# Patient Record
Sex: Male | Born: 1945 | Race: White | Hispanic: No | Marital: Married | Smoking: Former smoker
Health system: Southern US, Community
[De-identification: ages and names within clinical notes are randomized; demographics above are authoritative.]

## PROBLEM LIST (undated history)

## (undated) DIAGNOSIS — F419 Anxiety disorder, unspecified: Secondary | ICD-10-CM

## (undated) DIAGNOSIS — K219 Gastro-esophageal reflux disease without esophagitis: Secondary | ICD-10-CM

## (undated) DIAGNOSIS — Z8719 Personal history of other diseases of the digestive system: Secondary | ICD-10-CM

## (undated) DIAGNOSIS — M1990 Unspecified osteoarthritis, unspecified site: Secondary | ICD-10-CM

## (undated) DIAGNOSIS — F329 Major depressive disorder, single episode, unspecified: Secondary | ICD-10-CM

## (undated) DIAGNOSIS — F32A Depression, unspecified: Secondary | ICD-10-CM

## (undated) HISTORY — PX: ROTATOR CUFF REPAIR: SHX139

## (undated) HISTORY — PX: CHOLECYSTECTOMY: SHX55

---

## 2016-05-27 ENCOUNTER — Other Ambulatory Visit: Payer: Self-pay | Admitting: Physician Assistant

## 2016-05-29 ENCOUNTER — Encounter (HOSPITAL_COMMUNITY): Payer: Self-pay

## 2016-05-29 ENCOUNTER — Encounter (HOSPITAL_COMMUNITY)
Admission: RE | Admit: 2016-05-29 | Discharge: 2016-05-29 | Disposition: A | Discharge status: Home / Self Care | Payer: Medicare Other | Source: Ambulatory Visit | Attending: Orthopaedic Surgery | Admitting: Orthopaedic Surgery

## 2016-05-29 DIAGNOSIS — Z882 Allergy status to sulfonamides status: Secondary | ICD-10-CM | POA: Diagnosis not present

## 2016-05-29 DIAGNOSIS — Z0183 Encounter for blood typing: Secondary | ICD-10-CM | POA: Insufficient documentation

## 2016-05-29 DIAGNOSIS — K219 Gastro-esophageal reflux disease without esophagitis: Secondary | ICD-10-CM | POA: Insufficient documentation

## 2016-05-29 DIAGNOSIS — Z79899 Other long term (current) drug therapy: Secondary | ICD-10-CM | POA: Diagnosis not present

## 2016-05-29 DIAGNOSIS — M1611 Unilateral primary osteoarthritis, right hip: Secondary | ICD-10-CM | POA: Diagnosis not present

## 2016-05-29 DIAGNOSIS — Z01812 Encounter for preprocedural laboratory examination: Secondary | ICD-10-CM | POA: Insufficient documentation

## 2016-05-29 DIAGNOSIS — Z87891 Personal history of nicotine dependence: Secondary | ICD-10-CM | POA: Diagnosis not present

## 2016-05-29 HISTORY — DX: Anxiety disorder, unspecified: F41.9

## 2016-05-29 HISTORY — DX: Unspecified osteoarthritis, unspecified site: M19.90

## 2016-05-29 HISTORY — DX: Major depressive disorder, single episode, unspecified: F32.9

## 2016-05-29 HISTORY — DX: Personal history of other diseases of the digestive system: Z87.19

## 2016-05-29 HISTORY — DX: Gastro-esophageal reflux disease without esophagitis: K21.9

## 2016-05-29 HISTORY — DX: Depression, unspecified: F32.A

## 2016-05-29 LAB — CBC
HEMATOCRIT: 51.9 % (ref 39.0–52.0)
HEMOGLOBIN: 18.3 g/dL — AB (ref 13.0–17.0)
MCH: 32.9 pg (ref 26.0–34.0)
MCHC: 35.3 g/dL (ref 30.0–36.0)
MCV: 93.3 fL (ref 78.0–100.0)
Platelets: 246 10*3/uL (ref 150–400)
RBC: 5.56 MIL/uL (ref 4.22–5.81)
RDW: 13.3 % (ref 11.5–15.5)
WBC: 8.7 10*3/uL (ref 4.0–10.5)

## 2016-05-29 LAB — ABO/RH: ABO/RH(D): O POS

## 2016-05-29 NOTE — Patient Instructions (Signed)
Derek Velez  05/29/2016   Your procedure is scheduled on: 06/06/16  Report to [facility] Main  Entrance take [facility]  elevators to 3rd floor to  Short Stay Center at  5:30 AM.  Call this number if you have problems the morning of surgery [phone]   Remember: ONLY 1 PERSON MAY GO WITH YOU TO SHORT STAY TO GET  READY MORNING OF YOUR SURGERY.  Do not eat food or drink liquids :After MidnightThursday                                  You may not have any metal on your body .              Do not wear jewelry, lotions, powders or perfumes, deodorant               Do not bring valuables to the hospital. [place] IS NOT             RESPONSIBLE   FOR VALUABLES.  Contacts, dentures or bridgework may not be worn into surgery.  Leave suitcase in the car. After surgery it may be brought to your room.    _____________________________________________________________________             [facility] - Preparing for Surgery Before surgery, you can play an important role.  Because skin is not sterile, your skin needs to be as free of germs as possible.  You can reduce the number of germs on your skin by washing with CHG (chlorahexidine gluconate) soap before surgery.  CHG is an antiseptic cleaner which kills germs and bonds with the skin to continue killing germs even after washing. Please DO NOT use if you have an allergy to CHG or antibacterial soaps.  If your skin becomes reddened/irritated stop using the CHG and inform your nurse when you arrive at Short Stay. Do not shave (including legs and underarms) for at least 48 hours prior to the first CHG shower.  You may shave your face/neck. Please follow these instructions carefully:  1.  Shower with CHG Soap the night before surgery and the  morning of Surgery.  2.  If you choose to wash your hair, wash your hair first as usual with your  normal  shampoo.  3.  After you shampoo, rinse your hair and body thoroughly to remove  the  shampoo.                           4.  Use CHG as you would any other liquid soap.  You can apply chg directly  to the skin and wash                       Gently with a scrungie or clean washcloth.  5.  Apply the CHG Soap to your body ONLY FROM THE NECK DOWN.   Do not use on face/ open                           Wound or open sores. Avoid contact with eyes, ears mouth and genitals (private parts).                       Wash  face,  Genitals (private parts) with your normal soap.             6.  Wash thoroughly, paying special attention to the area where your surgery  will be performed.  7.  Thoroughly rinse your body with warm water from the neck down.  8.  DO NOT shower/wash with your normal soap after using and rinsing off  the CHG Soap.                9.  Pat yourself dry with a clean towel.            10.  Wear clean pajamas.            11.  Place clean sheets on your bed the night of your first shower and do not  sleep with pets. Day of Surgery : Do not apply any lotions/deodorants the morning of surgery.  Please wear clean clothes to the hospital/surgery center.  FAILURE TO FOLLOW THESE INSTRUCTIONS MAY RESULT IN THE CANCELLATION OF YOUR SURGERY PATIENT SIGNATURE_________________________________  NURSE SIGNATURE__________________________________  ________________________________________________________________________

## 2016-05-30 LAB — SURGICAL PCR SCREEN
MRSA, PCR: POSITIVE — AB
STAPHYLOCOCCUS AUREUS: POSITIVE — AB

## 2016-05-30 NOTE — Progress Notes (Signed)
05-30-16 0815 Pt. 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

## 2016-06-06 ENCOUNTER — Inpatient Hospital Stay (HOSPITAL_COMMUNITY): Payer: Medicare Other

## 2016-06-06 ENCOUNTER — Inpatient Hospital Stay (HOSPITAL_COMMUNITY): Payer: Medicare Other | Admitting: Anesthesiology

## 2016-06-06 ENCOUNTER — Inpatient Hospital Stay (HOSPITAL_COMMUNITY)
Admission: RE | Admit: 2016-06-06 | Discharge: 2016-06-07 | DRG: 470 | Disposition: A | Discharge status: Home Health | Payer: Medicare Other | Source: Ambulatory Visit | Attending: Orthopaedic Surgery | Admitting: Orthopaedic Surgery

## 2016-06-06 ENCOUNTER — Encounter (HOSPITAL_COMMUNITY): Payer: Self-pay

## 2016-06-06 ENCOUNTER — Encounter (HOSPITAL_COMMUNITY)
Admission: RE | Disposition: A | Discharge status: Home Health | Payer: Self-pay | Source: Ambulatory Visit | Attending: Orthopaedic Surgery

## 2016-06-06 DIAGNOSIS — K219 Gastro-esophageal reflux disease without esophagitis: Secondary | ICD-10-CM | POA: Diagnosis present

## 2016-06-06 DIAGNOSIS — Z79899 Other long term (current) drug therapy: Secondary | ICD-10-CM

## 2016-06-06 DIAGNOSIS — Z882 Allergy status to sulfonamides status: Secondary | ICD-10-CM | POA: Diagnosis not present

## 2016-06-06 DIAGNOSIS — M1611 Unilateral primary osteoarthritis, right hip: Secondary | ICD-10-CM | POA: Diagnosis present

## 2016-06-06 DIAGNOSIS — Z91018 Allergy to other foods: Secondary | ICD-10-CM | POA: Diagnosis not present

## 2016-06-06 DIAGNOSIS — Z96641 Presence of right artificial hip joint: Secondary | ICD-10-CM

## 2016-06-06 DIAGNOSIS — F329 Major depressive disorder, single episode, unspecified: Secondary | ICD-10-CM | POA: Diagnosis present

## 2016-06-06 DIAGNOSIS — F419 Anxiety disorder, unspecified: Secondary | ICD-10-CM | POA: Diagnosis present

## 2016-06-06 DIAGNOSIS — Z419 Encounter for procedure for purposes other than remedying health state, unspecified: Secondary | ICD-10-CM

## 2016-06-06 HISTORY — PX: TOTAL HIP ARTHROPLASTY: SHX124

## 2016-06-06 LAB — TYPE AND SCREEN
ABO/RH(D): O POS
ANTIBODY SCREEN: NEGATIVE

## 2016-06-06 SURGERY — ARTHROPLASTY, HIP, TOTAL, ANTERIOR APPROACH
Anesthesia: Spinal | Site: Hip | Laterality: Right

## 2016-06-06 MED ORDER — METOCLOPRAMIDE HCL 5 MG/ML IJ SOLN: 5.0000 mg | Freq: Three times a day (TID) | INTRAMUSCULAR | Status: DC | PRN

## 2016-06-06 MED ORDER — PROPOFOL 10 MG/ML IV BOLUS
INTRAVENOUS | Status: AC
  Filled 2016-06-06: qty 40

## 2016-06-06 MED ORDER — METOCLOPRAMIDE HCL 5 MG PO TABS: 5.0000 mg | ORAL_TABLET | Freq: Three times a day (TID) | ORAL | Status: DC | PRN

## 2016-06-06 MED ORDER — DIPHENHYDRAMINE HCL 12.5 MG/5ML PO ELIX
12.5000 mg | ORAL_SOLUTION | ORAL | Status: DC | PRN
  Administered 2016-06-06: 25 mg via ORAL
  Filled 2016-06-06: qty 10

## 2016-06-06 MED ORDER — FENTANYL CITRATE (PF) 100 MCG/2ML IJ SOLN
INTRAMUSCULAR | Status: DC | PRN
  Administered 2016-06-06 (×2): 50 ug via INTRAVENOUS

## 2016-06-06 MED ORDER — PHENYLEPHRINE HCL 10 MG/ML IJ SOLN
INTRAVENOUS | Status: DC | PRN
  Administered 2016-06-06: 25 ug/min via INTRAVENOUS

## 2016-06-06 MED ORDER — BUPIVACAINE IN DEXTROSE 0.75-8.25 % IT SOLN
INTRATHECAL | Status: DC | PRN
  Administered 2016-06-06: 2 mL via INTRATHECAL

## 2016-06-06 MED ORDER — FENTANYL CITRATE (PF) 100 MCG/2ML IJ SOLN
INTRAMUSCULAR | Status: AC
  Filled 2016-06-06: qty 2

## 2016-06-06 MED ORDER — STERILE WATER FOR IRRIGATION IR SOLN
Status: DC | PRN
  Administered 2016-06-06: 2000 mL

## 2016-06-06 MED ORDER — EPHEDRINE SULFATE 50 MG/ML IJ SOLN
INTRAMUSCULAR | Status: DC | PRN
  Administered 2016-06-06 (×2): 10 mg via INTRAVENOUS

## 2016-06-06 MED ORDER — ONDANSETRON HCL 4 MG/2ML IJ SOLN
INTRAMUSCULAR | Status: DC | PRN
Start: 2016-06-06 — End: 2016-06-06
  Administered 2016-06-06: 4 mg via INTRAVENOUS

## 2016-06-06 MED ORDER — VANCOMYCIN HCL 1000 MG IV SOLR
INTRAVENOUS | Status: DC | PRN
  Administered 2016-06-06: 1000 mg via INTRAVENOUS

## 2016-06-06 MED ORDER — PROMETHAZINE HCL 25 MG/ML IJ SOLN: 6.2500 mg | INTRAMUSCULAR | Status: DC | PRN

## 2016-06-06 MED ORDER — TRANEXAMIC ACID 1000 MG/10ML IV SOLN
1000.0000 mg | INTRAVENOUS | Status: AC
  Administered 2016-06-06: 1000 mg via INTRAVENOUS
  Filled 2016-06-06: qty 1100

## 2016-06-06 MED ORDER — VANCOMYCIN HCL IN DEXTROSE 1-5 GM/200ML-% IV SOLN
1000.0000 mg | Freq: Two times a day (BID) | INTRAVENOUS | Status: AC
Start: 2016-06-06 — End: 2016-06-06
  Administered 2016-06-06: 1000 mg via INTRAVENOUS
  Filled 2016-06-06: qty 200

## 2016-06-06 MED ORDER — METHOCARBAMOL 1000 MG/10ML IJ SOLN
500.0000 mg | Freq: Four times a day (QID) | INTRAMUSCULAR | Status: DC | PRN
  Administered 2016-06-06: 500 mg via INTRAVENOUS
  Filled 2016-06-06: qty 5
  Filled 2016-06-06: qty 550

## 2016-06-06 MED ORDER — PHENOL 1.4 % MT LIQD
1.0000 | OROMUCOSAL | Status: DC | PRN
  Filled 2016-06-06: qty 177

## 2016-06-06 MED ORDER — OXYCODONE HCL 5 MG PO TABS
5.0000 mg | ORAL_TABLET | ORAL | Status: DC | PRN
  Administered 2016-06-06 – 2016-06-07 (×2): 5 mg via ORAL
  Filled 2016-06-06 (×2): qty 1

## 2016-06-06 MED ORDER — FENTANYL CITRATE (PF) 100 MCG/2ML IJ SOLN
25.0000 ug | INTRAMUSCULAR | Status: DC | PRN
  Administered 2016-06-06 (×2): 50 ug via INTRAVENOUS

## 2016-06-06 MED ORDER — SODIUM CHLORIDE 0.9 % IR SOLN
Status: DC | PRN
  Administered 2016-06-06: 1000 mL

## 2016-06-06 MED ORDER — PHENYLEPHRINE HCL 10 MG/ML IJ SOLN
INTRAMUSCULAR | Status: AC
  Filled 2016-06-06: qty 1

## 2016-06-06 MED ORDER — ONDANSETRON HCL 4 MG PO TABS: 4.0000 mg | ORAL_TABLET | Freq: Four times a day (QID) | ORAL | Status: DC | PRN

## 2016-06-06 MED ORDER — MIDAZOLAM HCL 5 MG/5ML IJ SOLN
INTRAMUSCULAR | Status: DC | PRN
  Administered 2016-06-06 (×2): 1 mg via INTRAVENOUS

## 2016-06-06 MED ORDER — POLYETHYLENE GLYCOL 3350 17 G PO PACK: 17.0000 g | PACK | Freq: Every day | ORAL | Status: DC | PRN

## 2016-06-06 MED ORDER — PANTOPRAZOLE SODIUM 40 MG PO TBEC
40.0000 mg | DELAYED_RELEASE_TABLET | ORAL | Status: DC
  Filled 2016-06-06: qty 1

## 2016-06-06 MED ORDER — ACETAMINOPHEN 325 MG PO TABS: 650.0000 mg | ORAL_TABLET | Freq: Four times a day (QID) | ORAL | Status: DC | PRN

## 2016-06-06 MED ORDER — DOCUSATE SODIUM 100 MG PO CAPS
100.0000 mg | ORAL_CAPSULE | Freq: Two times a day (BID) | ORAL | Status: DC
  Administered 2016-06-06 – 2016-06-07 (×2): 100 mg via ORAL
  Filled 2016-06-06 (×2): qty 1

## 2016-06-06 MED ORDER — ASPIRIN EC 325 MG PO TBEC
325.0000 mg | DELAYED_RELEASE_TABLET | Freq: Every day | ORAL | Status: DC
  Administered 2016-06-07: 325 mg via ORAL
  Filled 2016-06-06: qty 1

## 2016-06-06 MED ORDER — CHLORHEXIDINE GLUCONATE 4 % EX LIQD: 60.0000 mL | Freq: Once | CUTANEOUS | Status: DC

## 2016-06-06 MED ORDER — EPHEDRINE SULFATE 50 MG/ML IJ SOLN
INTRAMUSCULAR | Status: AC
  Filled 2016-06-06: qty 1

## 2016-06-06 MED ORDER — ONDANSETRON HCL 4 MG/2ML IJ SOLN: 4.0000 mg | Freq: Four times a day (QID) | INTRAMUSCULAR | Status: DC | PRN

## 2016-06-06 MED ORDER — CEFAZOLIN SODIUM-DEXTROSE 2-4 GM/100ML-% IV SOLN: 2.0000 g | INTRAVENOUS | Status: DC

## 2016-06-06 MED ORDER — PANTOPRAZOLE SODIUM 40 MG PO TBEC: 40.0000 mg | DELAYED_RELEASE_TABLET | Freq: Every day | ORAL | Status: DC

## 2016-06-06 MED ORDER — ACETAMINOPHEN 650 MG RE SUPP: 650.0000 mg | Freq: Four times a day (QID) | RECTAL | Status: DC | PRN

## 2016-06-06 MED ORDER — ALUM & MAG HYDROXIDE-SIMETH 200-200-20 MG/5ML PO SUSP: 30.0000 mL | ORAL | Status: DC | PRN

## 2016-06-06 MED ORDER — LACTATED RINGERS IV SOLN: INTRAVENOUS | Status: DC

## 2016-06-06 MED ORDER — SODIUM CHLORIDE 0.9 % IJ SOLN
INTRAMUSCULAR | Status: AC
  Filled 2016-06-06: qty 10

## 2016-06-06 MED ORDER — HYDROMORPHONE HCL 1 MG/ML IJ SOLN: 1.0000 mg | INTRAMUSCULAR | Status: DC | PRN

## 2016-06-06 MED ORDER — MIDAZOLAM HCL 2 MG/2ML IJ SOLN
INTRAMUSCULAR | Status: AC
  Filled 2016-06-06: qty 2

## 2016-06-06 MED ORDER — LORAZEPAM 1 MG PO TABS
1.0000 mg | ORAL_TABLET | Freq: Every day | ORAL | Status: DC
  Administered 2016-06-06: 1 mg via ORAL
  Filled 2016-06-06: qty 1

## 2016-06-06 MED ORDER — ONDANSETRON HCL 4 MG/2ML IJ SOLN
INTRAMUSCULAR | Status: AC
  Filled 2016-06-06: qty 2

## 2016-06-06 MED ORDER — VANCOMYCIN HCL IN DEXTROSE 1-5 GM/200ML-% IV SOLN
INTRAVENOUS | Status: AC
  Filled 2016-06-06: qty 200

## 2016-06-06 MED ORDER — SODIUM CHLORIDE 0.9 % IV SOLN
INTRAVENOUS | Status: DC
  Administered 2016-06-06: 13:00:00 via INTRAVENOUS

## 2016-06-06 MED ORDER — LACTATED RINGERS IV SOLN
INTRAVENOUS | Status: DC | PRN
  Administered 2016-06-06 (×2): via INTRAVENOUS

## 2016-06-06 MED ORDER — PROPOFOL 500 MG/50ML IV EMUL
INTRAVENOUS | Status: DC | PRN
  Administered 2016-06-06: 100 ug/kg/min via INTRAVENOUS

## 2016-06-06 MED ORDER — MENTHOL 3 MG MT LOZG: 1.0000 | LOZENGE | OROMUCOSAL | Status: DC | PRN

## 2016-06-06 MED ORDER — KETOROLAC TROMETHAMINE 15 MG/ML IJ SOLN
7.5000 mg | Freq: Four times a day (QID) | INTRAMUSCULAR | Status: AC
  Administered 2016-06-06 – 2016-06-07 (×4): 7.5 mg via INTRAVENOUS
  Filled 2016-06-06 (×4): qty 1

## 2016-06-06 MED ORDER — METHOCARBAMOL 500 MG PO TABS: 500.0000 mg | ORAL_TABLET | Freq: Four times a day (QID) | ORAL | Status: DC | PRN

## 2016-06-06 SURGICAL SUPPLY — 45 items
BAG ZIPLOCK 12X15 (MISCELLANEOUS) ×3 IMPLANT
BENZOIN TINCTURE PRP APPL 2/3 (GAUZE/BANDAGES/DRESSINGS) ×3 IMPLANT
BLADE SAW SGTL 18X1.27X75 (BLADE) ×2 IMPLANT
BLADE SAW SGTL 18X1.27X75MM (BLADE) ×1
CAPT HIP TOTAL 2 ×3 IMPLANT
CELLS DAT CNTRL 66122 CELL SVR (MISCELLANEOUS) ×1 IMPLANT
CLOSURE WOUND 1/2 X4 (GAUZE/BANDAGES/DRESSINGS) ×1
CLOTH BEACON ORANGE TIMEOUT ST (SAFETY) ×3 IMPLANT
DRAPE STERI IOBAN 125X83 (DRAPES) ×3 IMPLANT
DRAPE U-SHAPE 47X51 STRL (DRAPES) ×6 IMPLANT
DRSG AQUACEL AG ADV 3.5X10 (GAUZE/BANDAGES/DRESSINGS) ×3 IMPLANT
DURAPREP 26ML APPLICATOR (WOUND CARE) ×3 IMPLANT
ELECT REM PT RETURN 9FT ADLT (ELECTROSURGICAL) ×3
ELECTRODE REM PT RTRN 9FT ADLT (ELECTROSURGICAL) ×1 IMPLANT
GAUZE XEROFORM 1X8 LF (GAUZE/BANDAGES/DRESSINGS) IMPLANT
GLOVE BIO SURGEON STRL SZ 6.5 (GLOVE) ×2 IMPLANT
GLOVE BIO SURGEON STRL SZ7.5 (GLOVE) ×3 IMPLANT
GLOVE BIO SURGEONS STRL SZ 6.5 (GLOVE) ×1
GLOVE BIOGEL PI IND STRL 6.5 (GLOVE) ×1 IMPLANT
GLOVE BIOGEL PI IND STRL 7.0 (GLOVE) ×1 IMPLANT
GLOVE BIOGEL PI IND STRL 7.5 (GLOVE) ×2 IMPLANT
GLOVE BIOGEL PI IND STRL 8 (GLOVE) ×2 IMPLANT
GLOVE BIOGEL PI INDICATOR 6.5 (GLOVE) ×2
GLOVE BIOGEL PI INDICATOR 7.0 (GLOVE) ×2
GLOVE BIOGEL PI INDICATOR 7.5 (GLOVE) ×4
GLOVE BIOGEL PI INDICATOR 8 (GLOVE) ×4
GLOVE ECLIPSE 8.0 STRL XLNG CF (GLOVE) ×6 IMPLANT
GLOVE SURG SS PI 7.0 STRL IVOR (GLOVE) ×3 IMPLANT
GOWN STRL REUS W/TWL XL LVL3 (GOWN DISPOSABLE) ×12 IMPLANT
HANDPIECE INTERPULSE COAX TIP (DISPOSABLE) ×2
HOLDER FOLEY CATH W/STRAP (MISCELLANEOUS) ×3 IMPLANT
PACK ANTERIOR HIP CUSTOM (KITS) ×3 IMPLANT
RTRCTR WOUND ALEXIS 18CM MED (MISCELLANEOUS) ×3
SET HNDPC FAN SPRY TIP SCT (DISPOSABLE) ×1 IMPLANT
STAPLER VISISTAT 35W (STAPLE) IMPLANT
STRIP CLOSURE SKIN 1/2X4 (GAUZE/BANDAGES/DRESSINGS) ×2 IMPLANT
SUT ETHIBOND NAB CT1 #1 30IN (SUTURE) ×3 IMPLANT
SUT MNCRL AB 4-0 PS2 18 (SUTURE) IMPLANT
SUT VIC AB 0 CT1 36 (SUTURE) ×3 IMPLANT
SUT VIC AB 1 CT1 36 (SUTURE) ×3 IMPLANT
SUT VIC AB 2-0 CT1 27 (SUTURE) ×4
SUT VIC AB 2-0 CT1 TAPERPNT 27 (SUTURE) ×2 IMPLANT
TRAY CATH 16FR W/PLASTIC CATH (SET/KITS/TRAYS/PACK) ×3 IMPLANT
TRAY FOLEY W/METER SILVER 16FR (SET/KITS/TRAYS/PACK) ×3 IMPLANT
YANKAUER SUCT BULB TIP NO VENT (SUCTIONS) ×3 IMPLANT

## 2016-06-06 NOTE — Anesthesia Preprocedure Evaluation (Addendum)
Anesthesia Evaluation  Patient identified by MRN, date of birth, ID band Patient awake    Reviewed: Allergy & Precautions, NPO status , Patient's Chart, lab work & pertinent test results  Airway Mallampati: I  TM Distance: >3 FB Neck ROM: Full    Dental  (+) Edentulous Upper, Edentulous Lower   Pulmonary neg pulmonary ROS, former smoker,    Pulmonary exam normal        Cardiovascular negative cardio ROS Normal cardiovascular exam     Neuro/Psych PSYCHIATRIC DISORDERS Depression negative neurological ROS     GI/Hepatic Neg liver ROS, hiatal hernia, GERD  ,  Endo/Other  negative endocrine ROS  Renal/GU negative Renal ROS  negative genitourinary   Musculoskeletal negative musculoskeletal ROS (+) Arthritis ,   Abdominal Normal abdominal exam  (+)   Peds negative pediatric ROS (+)  Hematology negative hematology ROS (+)   Anesthesia Other Findings   Reproductive/Obstetrics negative OB ROS                            Anesthesia Physical Anesthesia Plan  ASA: II  Anesthesia Plan: Spinal   Post-op Pain Management:    Induction:   Airway Management Planned: Nasal Cannula  Additional Equipment: None  Intra-op Plan:   Post-operative Plan:   Informed Consent:   Plan Discussed with: CRNA and Surgeon  Anesthesia Plan Comments:        Anesthesia Quick Evaluation

## 2016-06-06 NOTE — Op Note (Signed)
NAMManus Rudd:  Goody, Eliasar                 ACCOUNT NO.:  [id number]  MEDICAL RECORD NO.:  [id number]  LOCATION:  [facility]                         [facility]  PHYSICIAN:  Vanita PandaChristopher Y. 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  PROCEDURE:  Right total hip arthroplasty through direct anterior approach.  IMPLANTS:  DePuy sector cup/liner size 52 with a single screw, size 36+ 0 neutral polyethylene liner, size 10 Corail femoral component with standard offset, size 36+ 1.5 ceramic hip ball.  SURGEON:  Park Literhristopher Y Butt, MD  ASSISTANT:  Richardean CanalGilbert Clark, PA-C.  ANESTHESIA:  Spinal.  ANTIBIOTICS:  1 g IV vancomycin.  BLOOD LOSS:  200 mL.  COMPLICATIONS:  None.  INDICATIONS:  Mr. Melvyn NethLewis is a 70 year old gentleman with severe debilitating arthritis of his right hip.  This has been getting rapidly worse after about 3 years.  He says it has been hurting for many years now.  His right leg is now significantly shorter than the left side. His x-ray show flattening of the femoral head and complete loss of the joint space with sclerotic and cystic changes and almost appears its necrosis on top of osteoarthritis.  With the failure of conservative treatment, he wished to proceed with a total hip arthroplasty through direct anterior approach.  He understands the risk of acute blood loss anemia, nerve and vessel injury, fracture, infection, dislocation, DVT. He understands our goals are decreased pain, improved mobility, and overall improved quality of life.  DESCRIPTION OF PROCEDURE:  After informed consent was obtained, appropriate right hip was marked.  He was brought to the operating room. Spinal  anesthesia was obtained on a stretcher.  He was then laid in a supine position.  Traction boots were placed on both his feet.  Next, he was placed supine on the Hana fracture table with the perineal post in place and both legs in inline skeletal traction.  Time-out was called. We then prepped the right hip with DuraPrep and sterile drapes.  Time- out was called to identify correct patient, correct right hip.  I then made an incision inferior and posterior to the anterior superior iliac spine and carried this obliquely down the leg.  We dissected down the tensor fascia lata muscle and tensor fascia was then divided longitudinally, so we could proceed with a direct anterior approach to the hip.  We identified and cauterized the circumflex vessels and then identified the hip capsule.  I opened up the hip capsule in L-type format finding a very large joint effusion.  We placed Cobra retractors within the hip capsule and then made our femoral neck cut with an oscillating saw proximal to the lesser trochanter and completed this on osteotome.  We placed a corkscrew guide in the femoral head and removed the femoral head its entirety and found to be completely devoid of cartilage.  I then placed a bent Hohmann over the medial acetabular rim and cleaned the remnants  of the acetabular labrum.  I then began reaming under direct visualization from a size 43 reamer and stepwise increments up to a size 52 with all reamers under direct visualization.  The last reamer under direct fluoroscopy, so I could obtain my depth of reaming, our inclination, and anteversion.  Once I was pleased with this, I placed the real DePuy Sector Gription acetabular component size 52, I did place a single screw.  I then put in a 36+ 0 neutral polyethylene liner for that size acetabular component.  Attention was then turned to the femur.  With the leg externally rotated to 120 degrees, extended and adducted, I was able to  release the lateral joint capsule.  I used a Mueller retractor medially and a Hohmann retractor behind the greater trochanter.  I then used a rongeur to lateralize and a box cutting osteotome to enter the femoral canal.  We then began broaching from a size 8 broach only up to a size 10 broach because he has a narrow canal with a size 10 broach in place, we trialed a standard offset femoral neck and a 36+ 1.5 hip ball.  We brought the leg back over and up with traction and rotation reducing the pelvis.  We were pleased with leg length, offset, and stability on range of motion.  We then dislocated the hip and removed the trial components.  We then were able to place the real Corail femoral component with standard offset, size 10 and the real size 36+ 1.5 ceramic hip ball.  We then reduced this in the pelvis again we were into the acetabulum.  We were pleased with stability.  We then irrigated the soft tissue with normal saline solution using pulsatile lavage.  We were able to close the joint capsule with interrupted #1 Ethibond suture followed by running #1 Vicryl the tensor fascia, 0 Vicryl deep tissue, 2-0 Vicryl in the subcutaneous tissue, 4-0 Monocryl subcuticular stitch, and Steri-Strips on the skin.  An Aquacel dressing was applied.  In and out Foley catheter was then performed.  He was then taken recovery room in stable condition.  All final counts were correct.  There were no complications noted.  Of note, Richardean Canal, PA- C assisted in the entire case.  His assistance was crucial for facilitating all aspects of this case.     Vanita Panda. Magnus Ivan, M.D.     CYB/MEDQ  D:  06/06/2016  T:  06/06/2016  Job:  [id number]

## 2016-06-06 NOTE — Evaluation (Signed)
Physical Therapy Evaluation Patient Details Name: Derek Velez MRN: [id number] DOB: 09/21/1946 Today's Date: 06/06/2016   History of Present Illness  Pt s/p R THR  Clinical Impression  Pt s/p R THR presents with decreased R LE strength/ROM and post op pain limiting functional mobility.  Pt should progress well to dc home with family assist and HHPT follow up.    Follow Up Recommendations Home health PT    Equipment Recommendations  None recommended by PT    Recommendations for Other Services OT consult     Precautions / Restrictions Precautions Precautions: Fall Restrictions Weight Bearing Restrictions: No Other Position/Activity Restrictions: WBAT      Mobility  Bed Mobility Overal bed mobility: Needs Assistance Bed Mobility: Supine to Sit     Supine to sit: Min assist     General bed mobility comments: cues for sequence and use of L LE to self assist  Transfers Overall transfer level: Needs assistance Equipment used: Rolling walker (2 wheeled) Transfers: Sit to/from Stand Sit to Stand: Min assist         General transfer comment: cues for LE management and use of UEs to self assist  Ambulation/Gait Ambulation/Gait assistance: Min assist Ambulation Distance (Feet): 123 Feet Assistive device: Rolling walker (2 wheeled) Gait Pattern/deviations: Step-to pattern;Step-through pattern;Decreased step length - right;Decreased step length - left;Shuffle;Trunk flexed     General Gait Details: cues for posture, position from RW and initial sequence  Stairs            Wheelchair Mobility    Modified Rankin (Stroke Patients Only)       Balance                                             Pertinent Vitals/Pain Pain Assessment: 0-10 Pain Score: 3  Pain Location: R hip Pain Descriptors / Indicators: Aching;Sore Pain Intervention(s): Limited activity within patient's tolerance;Monitored during session;Premedicated before session;Ice  applied    Home Living Family/patient expects to be discharged to:: Private residence Living Arrangements: Spouse/significant other Available Help at Discharge: Family Type of Home: House Home Access: Stairs to enter   Secretary/administrator of Steps: 1 Home Layout: One level Home Equipment: Environmental consultant - 2 wheels      Prior Function Level of Independence: Independent               Hand Dominance        Extremity/Trunk Assessment   Upper Extremity Assessment: Overall WFL for tasks assessed           Lower Extremity Assessment: RLE deficits/detail      Cervical / Trunk Assessment: Normal  Communication   Communication: No difficulties  Cognition Arousal/Alertness: Awake/alert Behavior During Therapy: WFL for tasks assessed/performed Overall Cognitive Status: Within Functional Limits for tasks assessed                      General Comments      Exercises Total Joint Exercises Ankle Circles/Pumps: AROM;Both;15 reps;Supine      Assessment/Plan    PT Assessment Patient needs continued PT services  PT Diagnosis Difficulty walking   PT Problem List Decreased strength;Decreased range of motion;Decreased activity tolerance;Decreased mobility;Decreased knowledge of use of DME;Pain;Decreased knowledge of precautions  PT Treatment Interventions DME instruction;Gait training;Stair training;Functional mobility training;Therapeutic activities;Therapeutic exercise;Patient/family education   PT Goals (Current goals can be found in the  Care Plan section) Acute Rehab PT Goals Patient Stated Goal: Regain IND and walk without a limp PT Goal Formulation: With patient Time For Goal Achievement: 06/11/16 Potential to Achieve Goals: Good    Frequency 7X/week   Barriers to discharge        Co-evaluation               End of Session Equipment Utilized During Treatment: Gait belt Activity Tolerance: Patient tolerated treatment well Patient left: in  chair;with call bell/phone within reach;with chair alarm set;with family/visitor present Nurse Communication: Mobility status         Time: [id number] PT Time Calculation (min) (ACUTE ONLY): 23 min   Charges:   PT Evaluation $PT Eval Low Complexity: 1 Procedure PT Treatments $Gait Training: 8-22 mins   PT G Codes:        Neeti Knudtson 06/06/2016, 6:11 PM

## 2016-06-06 NOTE — Anesthesia Postprocedure Evaluation (Signed)
Anesthesia Post Note  Patient: Rockne MenghiniBruce Rushing  Procedure(s) Performed: Procedure(s) (LRB): RIGHT TOTAL HIP ARTHROPLASTY ANTERIOR APPROACH (Right)  Patient location during evaluation: PACU Anesthesia Type: Spinal Level of consciousness: oriented and awake and alert Pain management: pain level controlled Vital Signs Assessment: post-procedure vital signs reviewed and stable Respiratory status: spontaneous breathing, respiratory function stable and patient connected to nasal cannula oxygen Cardiovascular status: blood pressure returned to baseline and stable Postop Assessment: no headache and no backache Anesthetic complications: no    Last Vitals:  Vitals:   06/06/16 0526 06/06/16 0915  BP: (!) 146/94 112/70  Pulse: 80 94  Resp: 18   Temp: 37.1 C 36.6 C    Last Pain:  Vitals:   06/06/16 0915  TempSrc:   PainSc: 0-No pain                 Bonita Quinichard S Caelan Branden

## 2016-06-06 NOTE — Brief Op Note (Signed)
06/06/2016  8:49 AM  PATIENT:  Rockne MenghiniBruce Crisostomo  70 y.o. male  PRE-OPERATIVE DIAGNOSIS:  Severe osteoarthritis right hip  POST-OPERATIVE DIAGNOSIS:  Severe osteoarthritis right hip  PROCEDURE:  Procedure(s): RIGHT TOTAL HIP ARTHROPLASTY ANTERIOR APPROACH (Right)  SURGEON:  Surgeon(s) and Role:    * Kathryne Hitchhristopher Y Yordy Matton, MD - Primary  PHYSICIAN ASSISTANT: Rexene EdisonGil Clark, PA-C  ANESTHESIA:   spinal  EBL:  Total I/O In: -  Out: 200 [Blood:200]  COUNTS:  YES  DICTATION: .Other Dictation: Dictation Number [id number]  PLAN OF CARE: Admit to inpatient   PATIENT DISPOSITION:  PACU - hemodynamically stable.   Delay start of Pharmacological VTE agent (>24hrs) due to surgical blood loss or risk of bleeding: no

## 2016-06-06 NOTE — Transfer of Care (Signed)
Immediate Anesthesia Transfer of Care Note  Patient: Derek Velez  Procedure(s) Performed: Procedure(s): RIGHT TOTAL HIP ARTHROPLASTY ANTERIOR APPROACH (Right)  Patient Location: PACU  Anesthesia Type:Spinal  Level of Consciousness:  sedated, patient cooperative and responds to stimulation  Airway & Oxygen Therapy:Patient Spontanous Breathing and Patient connected to face mask oxgen  Post-op Assessment:  Report given to PACU RN and Post -op Vital signs reviewed and stable  Post vital signs:  Reviewed and stable  Last Vitals:  Vitals:   06/06/16 0526  BP: (!) 146/94  Pulse: 80  Resp: 18  Temp: 37.1 C    Complications: No apparent anesthesia complications

## 2016-06-06 NOTE — Progress Notes (Signed)
Patient and family education completed on contact isolation,  Positive PCR of staph and MRSA

## 2016-06-06 NOTE — H&P (Signed)
TOTAL HIP ADMISSION H&P  Patient is admitted for right total hip arthroplasty.  Subjective:  Chief Complaint: right hip pain  HPI: Derek Velez, 70 y.o. male, has a history of pain and functional disability in the right hip(s) due to arthritis and patient has failed non-surgical conservative treatments for greater than 12 weeks to include NSAID's and/or analgesics, use of assistive devices and activity modification.  Onset of symptoms was gradual starting 3 years ago with rapidlly worsening course since that time.The patient noted no past surgery on the right hip(s).  Patient currently rates pain in the right hip at 10 out of 10 with activity. Patient has night pain, worsening of pain with activity and weight bearing, trendelenberg gait, pain that interfers with activities of daily living and pain with passive range of motion. Patient has evidence of subchondral cysts, subchondral sclerosis, periarticular osteophytes and joint space narrowing by imaging studies. This condition presents safety issues increasing the risk of falls.  There is no current active infection.  Patient Active Problem List   Diagnosis Date Noted  . Osteoarthritis of right hip 06/06/2016   Past Medical History:  Diagnosis Date  . Anxiety    2002  . Arthritis   . Depression    2002  . GERD (gastroesophageal reflux disease)   . History of hiatal hernia     Past Surgical History:  Procedure Laterality Date  . CHOLECYSTECTOMY    . ROTATOR CUFF REPAIR     both shoulders    Prescriptions Prior to Admission  Medication Sig Dispense Refill Last Dose  . diphenhydrAMINE (BENADRYL) 25 MG tablet Take 50 mg by mouth at bedtime.   06/05/2016 at pm  . LORazepam (ATIVAN) 1 MG tablet Take 1 mg by mouth at bedtime.   06/05/2016 at pm  . meloxicam (MOBIC) 7.5 MG tablet Take 7.5 mg by mouth daily as needed for pain.   Past Month at Unknown time  . omeprazole (PRILOSEC) 20 MG capsule Take 20 mg by mouth every evening.   06/05/2016 at  pm   Allergies  Allergen Reactions  . Banana Other (See Comments)    Severe headache  . Sulfa Antibiotics Rash    Social History  Substance Use Topics  . Smoking status: Former Smoker    Quit date: 05/30/2003  . Smokeless tobacco: Current User    Types: Snuff  . Alcohol use No    History reviewed. No pertinent family history.   Review of Systems  Musculoskeletal: Positive for joint pain.  All other systems reviewed and are negative.   Objective:  Physical Exam  Constitutional: He is oriented to person, place, and time. He appears well-developed and well-nourished.  HENT:  Head: Normocephalic and atraumatic.  Eyes: EOM are normal. Pupils are equal, round, and reactive to light.  Neck: Normal range of motion. Neck supple.  Cardiovascular: Normal rate and regular rhythm.   Respiratory: Effort normal and breath sounds normal.  GI: Soft. Bowel sounds are normal.  Musculoskeletal:       Right hip: He exhibits decreased range of motion, decreased strength, tenderness and bony tenderness.  Neurological: He is alert and oriented to person, place, and time.  Skin: Skin is warm and dry.  Psychiatric: He has a normal mood and affect.    Vital signs in last 24 hours: Temp:  [98.8 F (37.1 C)] 98.8 F (37.1 C) (08/11 0526) Pulse Rate:  [80] 80 (08/11 0526) Resp:  [18] 18 (08/11 0526) BP: (146)/(94) 146/94 (08/11 0526) SpO2:  [  92 %] 92 % (08/11 0526) Weight:  [78.7 kg (173 lb 8 oz)] 78.7 kg (173 lb 8 oz) (08/11 0534)  Labs:   Estimated body mass index is 26 kg/m as calculated from the following:   Height as of this encounter: 5' 8.5" (1.74 m).   Weight as of this encounter: 78.7 kg (173 lb 8 oz).   Imaging Review Plain radiographs demonstrate severe degenerative joint disease of the right hip(s). The bone quality appears to be good for age and reported activity level.  Assessment/Plan:  End stage arthritis, right hip(s)  The patient history, physical examination,  clinical judgement of the provider and imaging studies are consistent with end stage degenerative joint disease of the right hip(s) and total hip arthroplasty is deemed medically necessary. The treatment options including medical management, injection therapy, arthroscopy and arthroplasty were discussed at length. The risks and benefits of total hip arthroplasty were presented and reviewed. The risks due to aseptic loosening, infection, stiffness, dislocation/subluxation,  thromboembolic complications and other imponderables were discussed.  The patient acknowledged the explanation, agreed to proceed with the plan and consent was signed. Patient is being admitted for inpatient treatment for surgery, pain control, PT, OT, prophylactic antibiotics, VTE prophylaxis, progressive ambulation and ADL's and discharge planning.The patient is planning to be discharged home with home health services

## 2016-06-06 NOTE — Anesthesia Procedure Notes (Signed)
Spinal  Patient location during procedure: OR Start time: 06/06/2016 7:35 AM End time: 06/06/2016 7:40 AM Staffing Anesthesiologist: Bonita QuinGUIDETTI, Derek Velez Performed: anesthesiologist  Preanesthetic Checklist Completed: patient identified, site marked, surgical consent, pre-op evaluation, timeout performed, IV checked, risks and benefits discussed and monitors and equipment checked Spinal Block Patient position: sitting Prep: ChloraPrep Patient monitoring: heart rate, cardiac monitor, continuous pulse ox and blood pressure Approach: midline Location: L4-5 Injection technique: single-shot Needle Needle type: Pencil-Tip  Needle gauge: 22 G Needle length: 10 cm Assessment Sensory level: L1 Additional Notes 2 ml 0.75% bupivacaine injected

## 2016-06-07 LAB — BASIC METABOLIC PANEL
ANION GAP: 5 (ref 5–15)
BUN: 16 mg/dL (ref 6–20)
CHLORIDE: 104 mmol/L (ref 101–111)
CO2: 26 mmol/L (ref 22–32)
Calcium: 8 mg/dL — ABNORMAL LOW (ref 8.9–10.3)
Creatinine, Ser: 0.88 mg/dL (ref 0.61–1.24)
Glucose, Bld: 108 mg/dL — ABNORMAL HIGH (ref 65–99)
POTASSIUM: 3.3 mmol/L — AB (ref 3.5–5.1)
SODIUM: 135 mmol/L (ref 135–145)

## 2016-06-07 LAB — CBC
HCT: 37.7 % — ABNORMAL LOW (ref 39.0–52.0)
HEMOGLOBIN: 12.7 g/dL — AB (ref 13.0–17.0)
MCH: 31.6 pg (ref 26.0–34.0)
MCHC: 33.7 g/dL (ref 30.0–36.0)
MCV: 93.8 fL (ref 78.0–100.0)
PLATELETS: 194 10*3/uL (ref 150–400)
RBC: 4.02 MIL/uL — AB (ref 4.22–5.81)
RDW: 13.5 % (ref 11.5–15.5)
WBC: 8.7 10*3/uL (ref 4.0–10.5)

## 2016-06-07 MED ORDER — ASPIRIN 325 MG PO TBEC: 325.0000 mg | DELAYED_RELEASE_TABLET | Freq: Every day | ORAL | 0 refills | Status: DC

## 2016-06-07 MED ORDER — OXYCODONE-ACETAMINOPHEN 5-325 MG PO TABS: 1.0000 | ORAL_TABLET | ORAL | 0 refills | Status: DC | PRN

## 2016-06-07 MED ORDER — METHOCARBAMOL 500 MG PO TABS: 500.0000 mg | ORAL_TABLET | Freq: Four times a day (QID) | ORAL | 0 refills | Status: DC | PRN

## 2016-06-07 NOTE — Progress Notes (Signed)
Physical Therapy Treatment Patient Details Name: Derek Velez MRN: [id number] DOB: 1946-07-19 Today's Date: 06/07/2016    History of Present Illness Pt s/p R THR    PT Comments    Pt progressing well and eager for return home.  Reviewed home therex, stairs and car transfers with pt and spouse.  Follow Up Recommendations  No PT follow up (Pt prefers no HHPT follow up.  Home therex program provided)     Equipment Recommendations  None recommended by PT    Recommendations for Other Services       Precautions / Restrictions Precautions Precautions: Fall Restrictions Weight Bearing Restrictions: No Other Position/Activity Restrictions: WBAT    Mobility  Bed Mobility Overal bed mobility: Needs Assistance Bed Mobility: Supine to Sit     Supine to sit: Supervision        Transfers Overall transfer level: Needs assistance Equipment used: Rolling walker (2 wheeled) Transfers: Sit to/from Stand Sit to Stand: Supervision         General transfer comment: cues for LE management and use of UEs to self assist  Ambulation/Gait Ambulation/Gait assistance: Min guard;Supervision Ambulation Distance (Feet): 150 Feet Assistive device: Rolling walker (2 wheeled) Gait Pattern/deviations: Step-to pattern;Step-through pattern;Decreased step length - right;Decreased step length - left;Shuffle;Trunk flexed Gait velocity: decr Gait velocity interpretation: Below normal speed for age/gender General Gait Details: cues for posture, position from RW and initial sequence   Stairs Stairs: Yes Stairs assistance: Min guard Stair Management: No rails;Step to pattern;Forwards;With walker Number of Stairs: 2 General stair comments: single step twice with RW and min cues for sequence and foot placement  Wheelchair Mobility    Modified Rankin (Stroke Patients Only)       Balance                                    Cognition Arousal/Alertness: Awake/alert Behavior  During Therapy: WFL for tasks assessed/performed Overall Cognitive Status: Within Functional Limits for tasks assessed                      Exercises Total Joint Exercises Ankle Circles/Pumps: AROM;Both;15 reps;Supine Quad Sets: AROM;Both;10 reps;Supine Heel Slides: AAROM;Right;20 reps;Supine Hip ABduction/ADduction: AAROM;Right;15 reps;Supine Long Arc Quad: AROM;Right;10 reps;Supine    General Comments        Pertinent Vitals/Pain Pain Assessment: 0-10 Pain Score: 5  Pain Location: R hip Pain Descriptors / Indicators: Aching;Sore Pain Intervention(s): Limited activity within patient's tolerance;Monitored during session;Ice applied;Patient requesting pain meds-RN notified    Home Living                      Prior Function            PT Goals (current goals can now be found in the care plan section) Acute Rehab PT Goals Patient Stated Goal: Regain IND and walk without a limp PT Goal Formulation: With patient Time For Goal Achievement: 06/11/16 Potential to Achieve Goals: Good Progress towards PT goals: Progressing toward goals    Frequency  7X/week    PT Plan Discharge plan needs to be updated    Co-evaluation             End of Session Equipment Utilized During Treatment: Gait belt Activity Tolerance: Patient tolerated treatment well Patient left: in chair;with call bell/phone within reach;with chair alarm set;with family/visitor present     Time: [id number] PT Time Calculation (min) (ACUTE  ONLY): 31 min  Charges:  $Gait Training: 8-22 mins $Therapeutic Exercise: 8-22 mins                    G Codes:      Derek Velez 2016-06-22, 12:27 PM

## 2016-06-07 NOTE — Progress Notes (Signed)
Per nurse patient will go home with HomeHealth. Patient walked13323ft. With  Minimal assistance, PT recommended HomeHealth. No other needs from CSW. Please reconsult if needed. LCSWA signing off at this time.   Vivi BarrackNicole Miu Chiong, Theresia MajorsLCSWA, MSW Clinical Social Worker 5E and Psych[facility] Service Line [phone] Weekend Cover  06/07/2016  9:04 AM

## 2016-06-07 NOTE — Discharge Instructions (Signed)

## 2016-06-07 NOTE — Progress Notes (Signed)
Patient discharged home, all discharge medications reviewed and questions answered. Patient to be assisted to vehicle by wheelchair.

## 2016-06-07 NOTE — Progress Notes (Signed)
Subjective: 1 Day Post-Op Procedure(s) (LRB): RIGHT TOTAL HIP ARTHROPLASTY ANTERIOR APPROACH (Right) Patient reports pain as moderate.  Working well with therapy.  Objective: Vital signs in last 24 hours: Temp:  [97.6 F (36.4 C)-99.5 F (37.5 C)] 99.5 F (37.5 C) (08/12 0547) Pulse Rate:  [62-94] 67 (08/12 0547) Resp:  [13-16] 16 (08/12 0547) BP: (92-122)/(52-90) 108/62 (08/12 0547) SpO2:  [90 %-99 %] 92 % (08/12 0547)  Intake/Output from previous day: 08/11 0701 - 08/12 0700 In: 3412.5 [P.O.:480; I.V.:2572.5; IV Piggyback:360] Out: 975 [Urine:775; Blood:200] Intake/Output this shift: No intake/output data recorded.   Recent Labs  06/07/16 0450  HGB 12.7*    Recent Labs  06/07/16 0450  WBC 8.7  RBC 4.02*  HCT 37.7*  PLT 194    Recent Labs  06/07/16 0450  NA 135  K 3.3*  CL 104  CO2 26  BUN 16  CREATININE 0.88  GLUCOSE 108*  CALCIUM 8.0*   No results for input(s): LABPT, INR in the last 72 hours.  Sensation intact distally Intact pulses distally Dorsiflexion/Plantar flexion intact Incision: dressing C/D/I  Assessment/Plan: 1 Day Post-Op Procedure(s) (LRB): RIGHT TOTAL HIP ARTHROPLASTY ANTERIOR APPROACH (Right) Up with therapy Discharge home with home health this afternoon.  Kathryne HitchBLACKMAN,Eliceo Gladu Y 06/07/2016, 7:36 AM

## 2016-06-07 NOTE — Discharge Summary (Signed)
Patient ID: Derek MenghiniBruce Velez MRN: [id number] DOB/AGE: [birth year] 69 y.o.  Admit date: 06/06/2016 Discharge date: 06/07/2016  Admission Diagnoses:  Principal Problem:   Osteoarthritis of right hip Active Problems:   Status post total replacement of right hip   Discharge Diagnoses:  Same  Past Medical History:  Diagnosis Date  . Anxiety    2002  . Arthritis   . Depression    2002  . GERD (gastroesophageal reflux disease)   . History of hiatal hernia     Surgeries: Procedure(s): RIGHT TOTAL HIP ARTHROPLASTY ANTERIOR APPROACH on 06/06/2016   Consultants:   Discharged Condition: Improved  Hospital Course: Derek Velez is an 70 y.o. male who was admitted 06/06/2016 for operative treatment ofOsteoarthritis of right hip. Patient has severe unremitting pain that affects sleep, daily activities, and work/hobbies. After pre-op clearance the patient was taken to the operating room on 06/06/2016 and underwent  Procedure(s): RIGHT TOTAL HIP ARTHROPLASTY ANTERIOR APPROACH.    Patient was given perioperative antibiotics: Anti-infectives    Start     Dose/Rate Route Frequency Ordered Stop   06/06/16 2000  vancomycin (VANCOCIN) IVPB 1000 mg/200 mL premix     1,000 mg 200 mL/hr over 60 Minutes Intravenous Every 12 hours 06/06/16 1059 06/06/16 2152   06/06/16 0523  ceFAZolin (ANCEF) IVPB 2g/100 mL premix  Status:  Discontinued     2 g 200 mL/hr over 30 Minutes Intravenous On call to O.R. 06/06/16 0523 06/06/16 1032       Patient was given sequential compression devices, early ambulation, and chemoprophylaxis to prevent DVT.  Patient benefited maximally from hospital stay and there were no complications.    Recent vital signs: Patient Vitals for the past 24 hrs:  BP Temp Temp src Pulse Resp SpO2  06/07/16 0547 108/62 99.5 F (37.5 C) Oral 67 16 92 %  06/07/16 0233 93/61 98.9 F (37.2 C) Oral 72 14 90 %  06/06/16 2213 (!) 92/53 98.7 F (37.1 C) Oral 63 14 93 %  06/06/16 1823 (!) 112/52  99 F (37.2 C) Oral 64 14 93 %  06/06/16 1335 (!) 110/59 98.3 F (36.8 C) Oral 74 15 96 %  06/06/16 1235 112/69 98.2 F (36.8 C) Oral 63 15 96 %  06/06/16 1129 111/68 97.6 F (36.4 C) Oral 62 15 95 %  06/06/16 1030 98/62 97.7 F (36.5 C) - 76 16 93 %  06/06/16 1015 113/90 97.8 F (36.6 C) - 72 15 97 %  06/06/16 1000 122/85 - - 76 15 99 %  06/06/16 0945 104/73 - - 82 13 91 %  06/06/16 0930 102/75 - - 80 15 96 %  06/06/16 0915 112/70 97.8 F (36.6 C) - 94 - 96 %     Recent laboratory studies:  Recent Labs  06/07/16 0450  WBC 8.7  HGB 12.7*  HCT 37.7*  PLT 194  NA 135  K 3.3*  CL 104  CO2 26  BUN 16  CREATININE 0.88  GLUCOSE 108*  CALCIUM 8.0*     Discharge Medications:     Medication List    TAKE these medications   aspirin 325 MG EC tablet Take 1 tablet (325 mg total) by mouth daily with breakfast.   diphenhydrAMINE 25 MG tablet Commonly known as:  BENADRYL Take 50 mg by mouth at bedtime.   LORazepam 1 MG tablet Commonly known as:  ATIVAN Take 1 mg by mouth at bedtime.   meloxicam 7.5 MG tablet Commonly known as:  MOBIC Take 7.5  mg by mouth daily as needed for pain.   methocarbamol 500 MG tablet Commonly known as:  ROBAXIN Take 1 tablet (500 mg total) by mouth every 6 (six) hours as needed for muscle spasms.   omeprazole 20 MG capsule Commonly known as:  PRILOSEC Take 20 mg by mouth every evening.   oxyCODONE-acetaminophen 5-325 MG tablet Commonly known as:  ROXICET Take 1-2 tablets by mouth every 4 (four) hours as needed.       Diagnostic Studies: Dg C-arm 1-60 Min-no Report  Result Date: 06/06/2016 CLINICAL DATA:  Post right hip replacement EXAM: Two views right hip x-ray including frontal view of the pelvis PROCEDURE: Frontal and cross-table x-ray view of the right hip submitted. Comparison:  Intraoperative films right hip same day FINDINGS: Two views of the right hip submitted including frontal view of the pelvis. Again noted right hip  prosthesis with anatomic alignment. Postsurgical changes are noted with small amount of periarticular soft tissue air. IMPRESSION: Right hip prosthesis with anatomic alignment. Electronically Signed   By: Natasha Mead M.D.   On: 06/06/2016 10:19   Dg Hip Port Unilat With Pelvis 1v Right  Result Date: 06/06/2016 CLINICAL DATA:  Post right hip replacement EXAM: Two views right hip x-ray including frontal view of the pelvis PROCEDURE: Frontal and cross-table x-ray view of the right hip submitted. Comparison:  Intraoperative films right hip same day FINDINGS: Two views of the right hip submitted including frontal view of the pelvis. Again noted right hip prosthesis with anatomic alignment. Postsurgical changes are noted with small amount of periarticular soft tissue air. IMPRESSION: Right hip prosthesis with anatomic alignment. Electronically Signed   By: Natasha Mead M.D.   On: 06/06/2016 10:19   Dg Hip Operative Unilat With Pelvis Right  Result Date: 06/06/2016 CLINICAL DATA:  Right total hip replacement.  Elective surgery. EXAM: OPERATIVE RIGHT HIP (WITH PELVIS IF PERFORMED)  VIEWS TECHNIQUE: Fluoroscopic spot image(s) were submitted for interpretation post-operatively. COMPARISON:  05/01/2016 FINDINGS: Right total hip arthroplasty appears to be intact on this single view. The entire right femoral stem is visualized without a periprosthetic fracture. Pubic rami are intact. IMPRESSION: Right total hip arthroplasty without complicating features. Electronically Signed   By: Richarda Overlie M.D.   On: 06/06/2016 08:54    Disposition: to home  Discharge Instructions    Call MD / Call 911    Complete by:  As directed   If you experience chest pain or shortness of breath, CALL 911 and be transported to the hospital emergency room.  If you develope a fever above 101 F, pus (white drainage) or increased drainage or redness at the wound, or calf pain, call your surgeon's office.   Constipation Prevention    Complete by:   As directed   Drink plenty of fluids.  Prune juice may be helpful.  You may use a stool softener, such as Colace (over the counter) 100 mg twice a day.  Use MiraLax (over the counter) for constipation as needed.   Diet - low sodium heart healthy    Complete by:  As directed   Discharge patient    Complete by:  As directed   Increase activity slowly as tolerated    Complete by:  As directed      Follow-up Information    Kathryne Hitch, MD Follow up in 2 week(s).   Specialty:  Orthopedic Surgery Contact information: [address] [place] [address] [phone]  SignedKathryne Hitch 06/07/2016, 7:39 AM

## 2016-06-08 NOTE — Care Management Note (Signed)
Case Management Note  Patient Details  Name: Derek Velez MRN: [id number] Date of Birth: 1946-05-26  Subjective/Objective:   S/p R THR                 Action/Plan: Discharge Planning: AVS reviewed:  06/07/2016 No HH PT follow up required. NCM made [facility] aware. Pt has RW at home.   Expected Discharge Date:  06/08/2016               Expected Discharge Plan:  Home w Home Health Services  In-House Referral:  NA  Discharge planning Services  CM Consult  Post Acute Care Choice:  Home Health Choice offered to:  Patient  DME Arranged:  N/A DME Agency:  NA  HH Arranged:  Patient Refused HH Agency:  NA  Status of Service:  Completed, signed off  If discussed at Long Length of Stay Meetings, dates discussed:    Additional Comments:  Elliot CousinShavis, Mariella Blackwelder Ellen, RN 06/08/2016, 7:29 PM

## 2017-01-21 ENCOUNTER — Ambulatory Visit (INDEPENDENT_AMBULATORY_CARE_PROVIDER_SITE_OTHER): Payer: Medicare Other | Admitting: Orthopaedic Surgery

## 2017-01-21 ENCOUNTER — Ambulatory Visit (INDEPENDENT_AMBULATORY_CARE_PROVIDER_SITE_OTHER): Payer: Medicare Other

## 2017-01-21 DIAGNOSIS — Z96641 Presence of right artificial hip joint: Secondary | ICD-10-CM

## 2017-01-21 NOTE — Progress Notes (Signed)
The patient is 8 months status post a right total hip arthroplasty direct injury approach. He has no complaints of the right hip at all. He says his left hip does hurt him either. He walks without a limp.  On examination his leg was are equal. Both hips show fluid range of motion with no pain or problems at all. An AP pelvis and lateral of his right operative hip shows a well-seated implant with no, getting features no evidence of loosening. He does have moderate arthritis in his left hip but again on exam his a systematic.  At this point I talked about things and would need to bring him back for his right operative hip was seen for anything else that he is having issues. He'll follow-up as needed.

## 2017-06-21 IMAGING — DX DG HIP (WITH OR WITHOUT PELVIS) 1V PORT*R*
2 series · 2 of 2 positions shown · non-contrast
Comparison: Intraoperative films right hip same day

CLINICAL DATA: Post right hip replacement

EXAM:
Two views right hip x-ray including frontal view of the pelvis
PROCEDURE:
Frontal and cross-table x-ray view of the right hip submitted.

[pelvis ap]
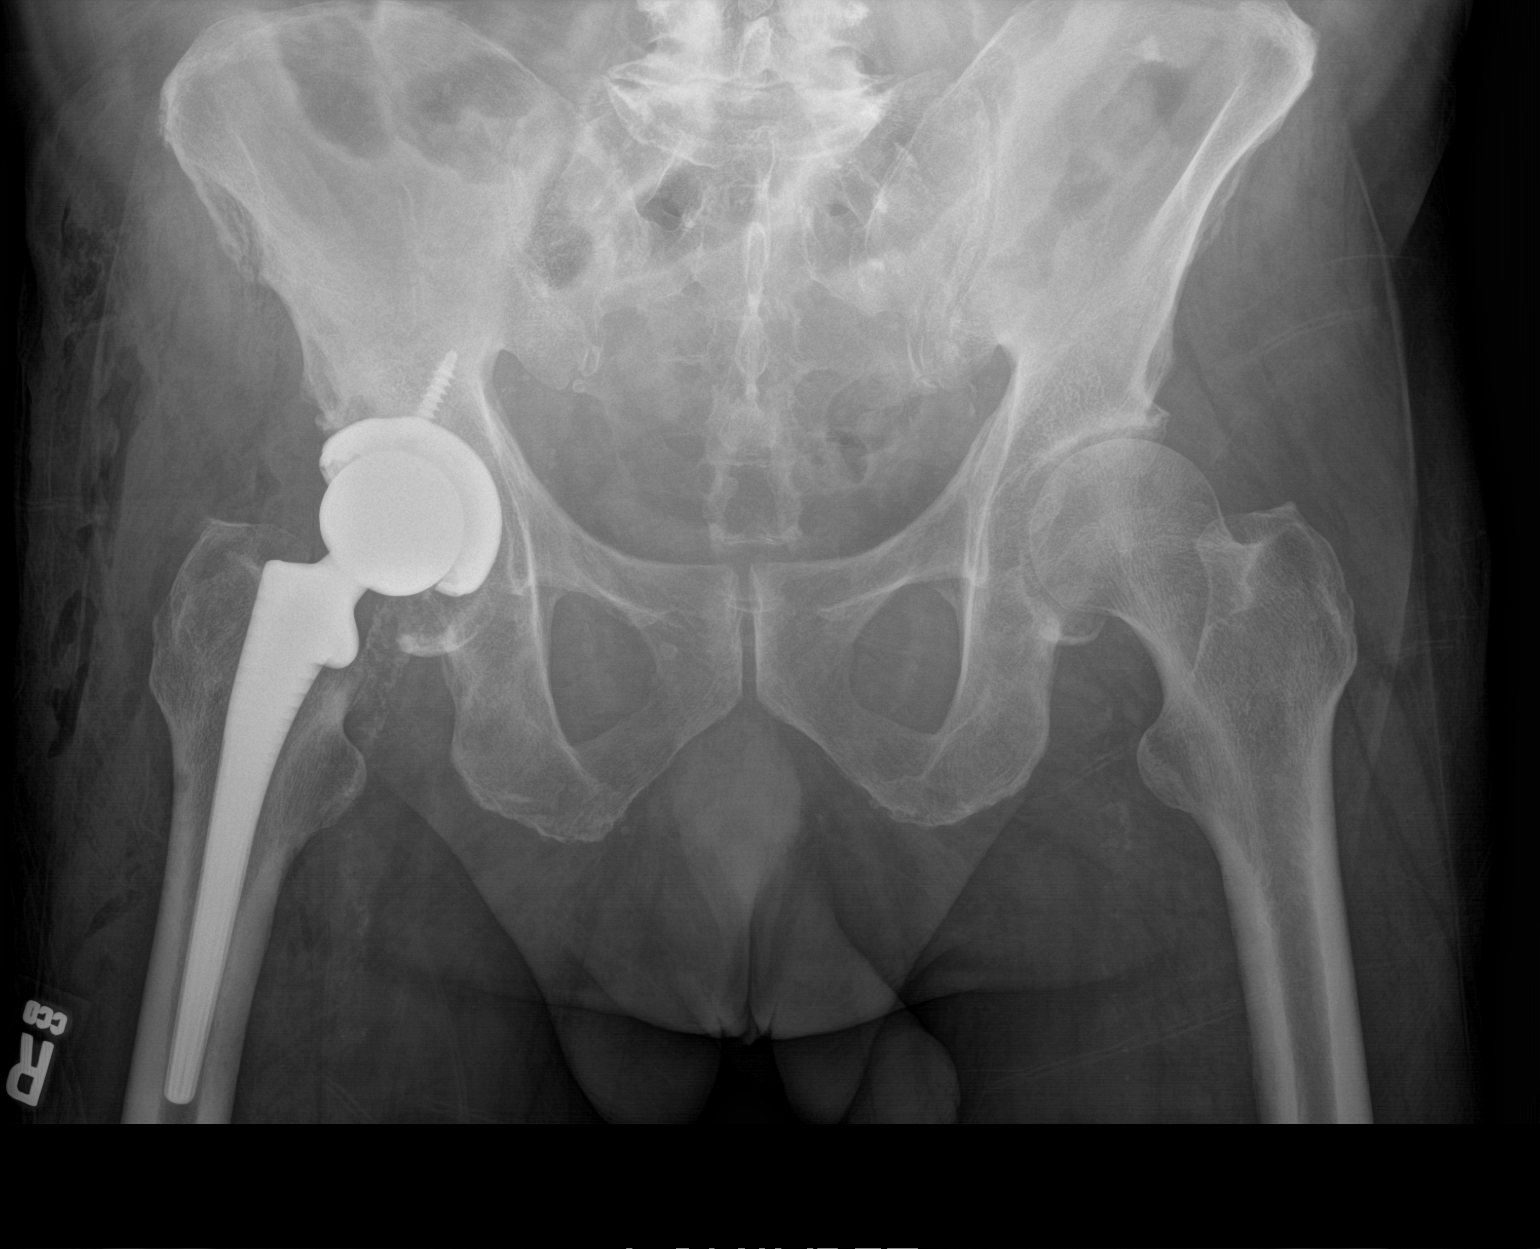

[hip lat]
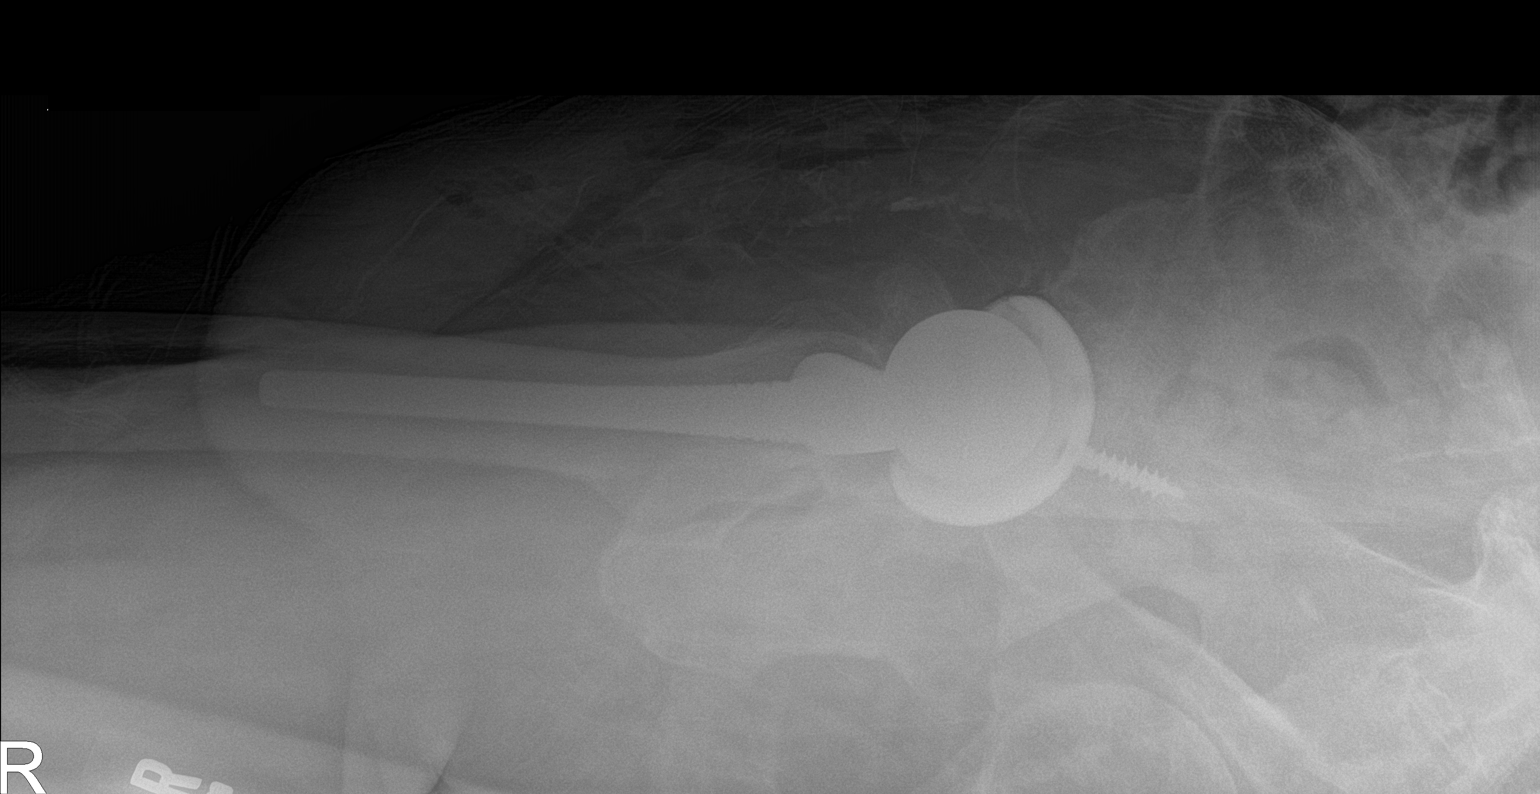

[2 of 2 positions shown; findings below may reference images not displayed]

FINDINGS: Two views of the right hip submitted including frontal view of the
pelvis. Again noted right hip prosthesis with anatomic alignment.
Postsurgical changes are noted with small amount of periarticular
soft tissue air.
IMPRESSION: Right hip prosthesis with anatomic alignment.

## 2018-11-01 ENCOUNTER — Ambulatory Visit (INDEPENDENT_AMBULATORY_CARE_PROVIDER_SITE_OTHER): Payer: Medicare Other | Admitting: Orthopaedic Surgery

## 2018-11-01 ENCOUNTER — Ambulatory Visit (INDEPENDENT_AMBULATORY_CARE_PROVIDER_SITE_OTHER): Payer: Medicare Other

## 2018-11-01 DIAGNOSIS — M25552 Pain in left hip: Secondary | ICD-10-CM

## 2018-11-01 DIAGNOSIS — M1612 Unilateral primary osteoarthritis, left hip: Secondary | ICD-10-CM | POA: Diagnosis not present

## 2018-11-01 NOTE — Progress Notes (Signed)
Office Visit Note   Patient: Derek Velez           Date of Birth: 11-11-1945           MRN: [id number] Visit Date: 11/01/2018              Requested by: Raul DelBradley, Leigh, MD No address on file PCP: Raul DelBradley, Leigh, MD   Assessment & Plan: Visit Diagnoses:  1. Pain of left hip joint   2. Unilateral primary osteoarthritis, left hip     Plan: Given the severity of his left hip arthritis on x-ray and the pain is causing on clinical exam, we are recommending a left total hip arthroplasty through a direct anterior approach.  We talked in detail about what surgery involves.  He is fully aware of this as well as the risk and benefits having had this done to his right side just 2 years ago.  All question concerns were answered and addressed.  We will work on getting the surgery scheduled.  Follow-Up Instructions: Return for 2 weeks post-op.   Orders:  Orders Placed This Encounter  Procedures  . XR HIP UNILAT W OR W/O PELVIS 2-3 VIEWS LEFT   No orders of the defined types were placed in this encounter.     Procedures: No procedures performed   Clinical Data: No additional findings.   Subjective: Chief Complaint  Patient presents with  . Left Hip - Pain  The patient is well-known to me.  He is 2 years status post a right total hip arthroplasty.  He has documented osteoarthritis in his left hip that is now significantly worsened over the last several months to a year.  His pain is daily and it is in the groin on the left side.  His left hip pain is now caused him to limp significantly.  His left hip pain is also detrimentally affected his activity living, his mobility, and his quality of life.  Is at this point he wants to consider a left total hip arthroplasty.  He has had no other active changes in medical status.  His right hip is doing excellent he states.  HPI  Review of Systems He currently denies any headache, chest pain, shortness of breath, fever, chills, nausea,  vomiting.  Objective: Vital Signs: There were no vitals taken for this visit.  Physical Exam He is alert orient x3 and in no acute distress Ortho Exam Examination of his right operative hip is normal in terms of range of motion with no pain at all.  Examination of his left hip shows significant stiffness with internal and external rotation.  There is actually blocks to rotation as well.  He is slightly shorter on his left side than his right side.  He has severe pain with rotation of his left hip. Specialty Comments:  No specialty comments available.  Imaging: Xr Hip Unilat W Or W/o Pelvis 2-3 Views Left  Result Date: 11/01/2018 An AP pelvis and lateral of the left hip show severe end-stage arthritis of left hip.  There are large para-articular osteophytes as well as joint space narrowing.  There are sclerotic changes in the femoral head and acetabulum.  There is a right total hip arthroplasty with no complicating features.    PMFS History: Patient Active Problem List   Diagnosis Date Noted  . Unilateral primary osteoarthritis, left hip 11/01/2018  . Osteoarthritis of right hip 06/06/2016  . Status post total replacement of right hip 06/06/2016   Past Medical  History:  Diagnosis Date  . Anxiety    2002  . Arthritis   . Depression    2002  . GERD (gastroesophageal reflux disease)   . History of hiatal hernia     No family history on file.  Past Surgical History:  Procedure Laterality Date  . CHOLECYSTECTOMY    . ROTATOR CUFF REPAIR     both shoulders  . TOTAL HIP ARTHROPLASTY Right 06/06/2016   Procedure: RIGHT TOTAL HIP ARTHROPLASTY ANTERIOR APPROACH;  Surgeon: Kathryne Hitchhristopher Y Kano Heckmann, MD;  Location: [facility];  Service: Orthopedics;  Laterality: Right;   Social History   Occupational History  . Not on file  Tobacco Use  . Smoking status: Former Smoker    Last attempt to quit: 05/30/2003    Years since quitting: 15.4  . Smokeless tobacco: Current User    Types: Snuff    Substance and Sexual Activity  . Alcohol use: No  . Drug use: No  . Sexual activity: Not on file

## 2018-11-15 NOTE — Progress Notes (Signed)
Please place orders in Epic as patient has a pre-op appointment on 11/18/2018! Thank you!

## 2018-11-16 ENCOUNTER — Other Ambulatory Visit (INDEPENDENT_AMBULATORY_CARE_PROVIDER_SITE_OTHER): Payer: Self-pay | Admitting: Physician Assistant

## 2018-11-18 ENCOUNTER — Other Ambulatory Visit: Payer: Self-pay

## 2018-11-18 ENCOUNTER — Encounter (HOSPITAL_COMMUNITY): Payer: Self-pay

## 2018-11-18 ENCOUNTER — Encounter (HOSPITAL_COMMUNITY)
Admission: RE | Admit: 2018-11-18 | Discharge: 2018-11-18 | Disposition: A | Discharge status: Home / Self Care | Payer: Medicare Other | Source: Ambulatory Visit | Attending: Orthopaedic Surgery | Admitting: Orthopaedic Surgery

## 2018-11-18 DIAGNOSIS — Z01812 Encounter for preprocedural laboratory examination: Secondary | ICD-10-CM | POA: Insufficient documentation

## 2018-11-18 LAB — CBC
HEMATOCRIT: 55.5 % — AB (ref 39.0–52.0)
Hemoglobin: 18.3 g/dL — ABNORMAL HIGH (ref 13.0–17.0)
MCH: 31.7 pg (ref 26.0–34.0)
MCHC: 33 g/dL (ref 30.0–36.0)
MCV: 96 fL (ref 80.0–100.0)
Platelets: 243 10*3/uL (ref 150–400)
RBC: 5.78 MIL/uL (ref 4.22–5.81)
RDW: 13.7 % (ref 11.5–15.5)
WBC: 8.5 10*3/uL (ref 4.0–10.5)
nRBC: 0 % (ref 0.0–0.2)

## 2018-11-18 LAB — BASIC METABOLIC PANEL
Anion gap: 10 (ref 5–15)
BUN: 14 mg/dL (ref 8–23)
CHLORIDE: 104 mmol/L (ref 98–111)
CO2: 26 mmol/L (ref 22–32)
Calcium: 9.2 mg/dL (ref 8.9–10.3)
Creatinine, Ser: 0.84 mg/dL (ref 0.61–1.24)
GFR calc Af Amer: >60 mL/min (ref >60)
GFR calc non Af Amer: >60 mL/min (ref >60)
Glucose, Bld: 103 mg/dL — ABNORMAL HIGH (ref 70–99)
Potassium: 4.1 mmol/L (ref 3.5–5.1)
Sodium: 140 mmol/L (ref 135–145)

## 2018-11-18 NOTE — Patient Instructions (Signed)
Rockne MenghiniBruce Westmoreland  11/18/2018   Your procedure is scheduled on: Friday 11/26/2018  [facility]             Report to admitting at  0600 AM    Call this number if you have problems the morning of surgery [phone]    Remember: Do not eat food or drink liquids :After Midnight.              BRUSH YOUR TEETH MORNING OF SURGERY AND RINSE YOUR MOUTH OUT, NO CHEWING GUM CANDY OR MINTS.     Take these medicines the morning of surgery with A SIP OF WATER: none                                    You may not have any metal on your body including hair pins and              piercings  Do not wear jewelry, make-up, lotions, powders or perfumes, deodorant                         Men may shave face and neck.   Do not bring valuables to the hospital. [facility] IS NOT             RESPONSIBLE   FOR VALUABLES.  Contacts, dentures or bridgework may not be worn into surgery.  Leave suitcase in the car. After surgery it may be brought to your room.                  Please read over the following fact sheets you were given: _____________________________________________________________________             [facility] Pre[facility] Before surgery, you can play an important role.  Because skin is not sterile, your skin needs to be as free of germs as possible.  You can reduce the number of germs on your skin by washing with CHG (chlorahexidine gluconate) soap before surgery.  CHG is an antiseptic cleaner which kills germs and bonds with the skin to continue killing germs even after washing. Please DO NOT use if you have an allergy to CHG or antibacterial soaps.  If your skin becomes reddened/irritated stop using the CHG and inform your nurse when you arrive at Short Stay. Do not shave (including legs and underarms) for at least 48 hours prior to the first CHG shower.  You may shave your face/neck. Please follow these instructions carefully:  1.   Shower with CHG Soap the night before surgery and the  morning of Surgery.  2.  If you choose to wash your hair, wash your hair first as usual with your  normal  shampoo.  3.  After you shampoo, rinse your hair and body thoroughly to remove the  shampoo.                           4.  Use CHG as you would any other liquid soap.  You can apply chg directly  to the skin and wash                       Gently with a scrungie or clean washcloth.  5.  Apply the CHG Soap to your body ONLY FROM THE NECK DOWN.   Do not use on face/ open                           Wound or open sores. Avoid contact with eyes, ears mouth and genitals (private parts).                       Wash face,  Genitals (private parts) with your normal soap.             6.  Wash thoroughly, paying special attention to the area where your surgery  will be performed.  7.  Thoroughly rinse your body with warm water from the neck down.  8.  DO NOT shower/wash with your normal soap after using and rinsing off  the CHG Soap.                9.  Pat yourself dry with a clean towel.            10.  Wear clean pajamas.            11.  Place clean sheets on your bed the night of your first shower and do not  sleep with pets. Day of Surgery : Do not apply any lotions/deodorants the morning of surgery.  Please wear clean clothes to the hospital/surgery center.  FAILURE TO FOLLOW THESE INSTRUCTIONS MAY RESULT IN THE CANCELLATION OF YOUR SURGERY PATIENT SIGNATURE_________________________________  NURSE SIGNATURE__________________________________  ________________________________________________________________________   Rogelia Mire  An incentive spirometer is a tool that can help keep your lungs clear and active. This tool measures how well you are filling your lungs with each breath. Taking long deep breaths may help reverse or decrease the chance of developing breathing (pulmonary) problems (especially infection) following:  A long  period of time when you are unable to move or be active. BEFORE THE PROCEDURE   If the spirometer includes an indicator to show your best effort, your nurse or respiratory therapist will set it to a desired goal.  If possible, sit up straight or lean slightly forward. Try not to slouch.  Hold the incentive spirometer in an upright position. INSTRUCTIONS FOR USE  1. Sit on the edge of your bed if possible, or sit up as far as you can in bed or on a chair. 2. Hold the incentive spirometer in an upright position. 3. Breathe out normally. 4. Place the mouthpiece in your mouth and seal your lips tightly around it. 5. Breathe in slowly and as deeply as possible, raising the piston or the ball toward the top of the column. 6. Hold your breath for 3-5 seconds or for as long as possible. Allow the piston or ball to fall to the bottom of the column. 7. Remove the mouthpiece from your mouth and breathe out normally. 8. Rest for a few seconds and repeat Steps 1 through 7 at least 10 times every 1-2 hours when you are awake. Take your time and take a few normal breaths between deep breaths. 9. The spirometer may include an indicator to show your best effort. Use the indicator as a goal to work toward during each repetition. 10. After each set of 10 deep breaths, practice coughing to be sure your lungs are clear. If you have an incision (the cut made at the time of surgery), support your incision when coughing by placing a  pillow or rolled up towels firmly against it. Once you are able to get out of bed, walk around indoors and cough well. You may stop using the incentive spirometer when instructed by your caregiver.  RISKS AND COMPLICATIONS  Take your time so you do not get dizzy or light-headed.  If you are in pain, you may need to take or ask for pain medication before doing incentive spirometry. It is harder to take a deep breath if you are having pain. AFTER USE  Rest and breathe slowly and  easily.  It can be helpful to keep track of a log of your progress. Your caregiver can provide you with a simple table to help with this. If you are using the spirometer at home, follow these instructions: SEEK MEDICAL CARE IF:   You are having difficultly using the spirometer.  You have trouble using the spirometer as often as instructed.  Your pain medication is not giving enough relief while using the spirometer.  You develop fever of 100.5 F (38.1 C) or higher. SEEK IMMEDIATE MEDICAL CARE IF:   You cough up bloody sputum that had not been present before.  You develop fever of 102 F (38.9 C) or greater.  You develop worsening pain at or near the incision site. MAKE SURE YOU:   Understand these instructions.  Will watch your condition.  Will get help right away if you are not doing well or get worse. Document Released: 02/23/2007 Document Revised: 01/05/2012 Document Reviewed: 04/26/2007 [facility] Patient Information 2014 [employer] [place].   ________________________________________________________________________

## 2018-11-19 LAB — SURGICAL PCR SCREEN
MRSA, PCR: POSITIVE — AB
Staphylococcus aureus: POSITIVE — AB

## 2018-11-25 NOTE — Anesthesia Preprocedure Evaluation (Addendum)
Anesthesia Evaluation  Patient identified by MRN, date of birth, ID band Patient awake    Reviewed: Allergy & Precautions, NPO status , Patient's Chart, lab work & pertinent test results  Airway Mallampati: I  TM Distance: >3 FB Neck ROM: Full    Dental no notable dental hx. (+) Edentulous Upper, Edentulous Lower   Pulmonary neg pulmonary ROS, former smoker,    Pulmonary exam normal breath sounds clear to auscultation       Cardiovascular negative cardio ROS Normal cardiovascular exam Rhythm:Regular Rate:Normal     Neuro/Psych PSYCHIATRIC DISORDERS Anxiety Depression negative neurological ROS     GI/Hepatic Neg liver ROS, hiatal hernia, GERD  Medicated,  Endo/Other  negative endocrine ROS  Renal/GU negative Renal ROS  negative genitourinary   Musculoskeletal  (+) Arthritis , Osteoarthritis,    Abdominal   Peds  Hematology negative hematology ROS (+)   Anesthesia Other Findings   Reproductive/Obstetrics                           Anesthesia Physical Anesthesia Plan  ASA: II  Anesthesia Plan: Spinal   Post-op Pain Management:    Induction:   PONV Risk Score and Plan: Treatment may vary due to age or medical condition  Airway Management Planned: Natural Airway  Additional Equipment:   Intra-op Plan:   Post-operative Plan:   Informed Consent: I have reviewed the patients History and Physical, chart, labs and discussed the procedure including the risks, benefits and alternatives for the proposed anesthesia with the patient or authorized representative who has indicated his/her understanding and acceptance.     Dental advisory given  Plan Discussed with: CRNA  Anesthesia Plan Comments:         Anesthesia Quick Evaluation

## 2018-11-26 ENCOUNTER — Ambulatory Visit (HOSPITAL_COMMUNITY): Payer: Medicare Other | Admitting: Anesthesiology

## 2018-11-26 ENCOUNTER — Observation Stay (HOSPITAL_COMMUNITY)
Admission: RE | Admit: 2018-11-26 | Discharge: 2018-11-27 | Disposition: A | Discharge status: Home Health | Payer: Medicare Other | Attending: Orthopaedic Surgery | Admitting: Orthopaedic Surgery

## 2018-11-26 ENCOUNTER — Ambulatory Visit (HOSPITAL_COMMUNITY): Payer: Medicare Other

## 2018-11-26 ENCOUNTER — Encounter (HOSPITAL_COMMUNITY): Payer: Self-pay | Admitting: Emergency Medicine

## 2018-11-26 ENCOUNTER — Other Ambulatory Visit: Payer: Self-pay

## 2018-11-26 ENCOUNTER — Encounter (HOSPITAL_COMMUNITY)
Admission: RE | Disposition: A | Discharge status: Home Health | Payer: Self-pay | Source: Home / Self Care | Attending: Orthopaedic Surgery

## 2018-11-26 ENCOUNTER — Observation Stay (HOSPITAL_COMMUNITY): Payer: Medicare Other

## 2018-11-26 ENCOUNTER — Ambulatory Visit (HOSPITAL_COMMUNITY): Payer: Medicare Other | Admitting: Physician Assistant

## 2018-11-26 DIAGNOSIS — Z419 Encounter for procedure for purposes other than remedying health state, unspecified: Secondary | ICD-10-CM

## 2018-11-26 DIAGNOSIS — Z79899 Other long term (current) drug therapy: Secondary | ICD-10-CM | POA: Insufficient documentation

## 2018-11-26 DIAGNOSIS — Z96642 Presence of left artificial hip joint: Secondary | ICD-10-CM

## 2018-11-26 DIAGNOSIS — F419 Anxiety disorder, unspecified: Secondary | ICD-10-CM | POA: Insufficient documentation

## 2018-11-26 DIAGNOSIS — Z882 Allergy status to sulfonamides status: Secondary | ICD-10-CM | POA: Diagnosis not present

## 2018-11-26 DIAGNOSIS — M1612 Unilateral primary osteoarthritis, left hip: Principal | ICD-10-CM | POA: Insufficient documentation

## 2018-11-26 DIAGNOSIS — K219 Gastro-esophageal reflux disease without esophagitis: Secondary | ICD-10-CM | POA: Insufficient documentation

## 2018-11-26 DIAGNOSIS — Z96641 Presence of right artificial hip joint: Secondary | ICD-10-CM | POA: Insufficient documentation

## 2018-11-26 DIAGNOSIS — Z87891 Personal history of nicotine dependence: Secondary | ICD-10-CM | POA: Insufficient documentation

## 2018-11-26 HISTORY — PX: TOTAL HIP ARTHROPLASTY: SHX124

## 2018-11-26 SURGERY — ARTHROPLASTY, HIP, TOTAL, ANTERIOR APPROACH
Anesthesia: Spinal | Site: Hip | Laterality: Left

## 2018-11-26 MED ORDER — BUPIVACAINE HCL (PF) 0.75 % IJ SOLN: INTRAMUSCULAR | Status: DC | PRN

## 2018-11-26 MED ORDER — SODIUM CHLORIDE 0.9 % IV SOLN
INTRAVENOUS | Status: DC | PRN
  Administered 2018-11-26: 25 ug/min via INTRAVENOUS

## 2018-11-26 MED ORDER — GLYCOPYRROLATE PF 0.2 MG/ML IJ SOSY
PREFILLED_SYRINGE | INTRAMUSCULAR | Status: AC
  Filled 2018-11-26: qty 1

## 2018-11-26 MED ORDER — ACETAMINOPHEN 500 MG PO TABS
1000.0000 mg | ORAL_TABLET | Freq: Once | ORAL | Status: AC
  Administered 2018-11-26: 1000 mg via ORAL
  Filled 2018-11-26: qty 2

## 2018-11-26 MED ORDER — PHENYLEPHRINE HCL 10 MG/ML IJ SOLN
INTRAMUSCULAR | Status: AC
  Filled 2018-11-26: qty 2

## 2018-11-26 MED ORDER — FENTANYL CITRATE (PF) 100 MCG/2ML IJ SOLN
25.0000 ug | INTRAMUSCULAR | Status: DC | PRN
  Administered 2018-11-26: 25 ug via INTRAVENOUS

## 2018-11-26 MED ORDER — LACTATED RINGERS IV SOLN
INTRAVENOUS | Status: DC
  Administered 2018-11-26 (×2): via INTRAVENOUS
  Administered 2018-11-26: 500 mL via INTRAVENOUS

## 2018-11-26 MED ORDER — MIDAZOLAM HCL 2 MG/2ML IJ SOLN
INTRAMUSCULAR | Status: AC
  Filled 2018-11-26: qty 2

## 2018-11-26 MED ORDER — PHENOL 1.4 % MT LIQD
1.0000 | OROMUCOSAL | Status: DC | PRN
  Filled 2018-11-26: qty 177

## 2018-11-26 MED ORDER — DEXAMETHASONE SODIUM PHOSPHATE 10 MG/ML IJ SOLN
INTRAMUSCULAR | Status: DC | PRN
  Administered 2018-11-26: 8 mg via INTRAVENOUS

## 2018-11-26 MED ORDER — ALUM & MAG HYDROXIDE-SIMETH 200-200-20 MG/5ML PO SUSP: 30.0000 mL | ORAL | Status: DC | PRN

## 2018-11-26 MED ORDER — DOCUSATE SODIUM 100 MG PO CAPS
100.0000 mg | ORAL_CAPSULE | Freq: Two times a day (BID) | ORAL | Status: DC
  Administered 2018-11-26 – 2018-11-27 (×2): 100 mg via ORAL
  Filled 2018-11-26 (×2): qty 1

## 2018-11-26 MED ORDER — OXYCODONE HCL 5 MG PO TABS
10.0000 mg | ORAL_TABLET | ORAL | Status: DC | PRN
  Administered 2018-11-26: 10 mg via ORAL

## 2018-11-26 MED ORDER — CHLORHEXIDINE GLUCONATE 4 % EX LIQD: 60.0000 mL | Freq: Once | CUTANEOUS | Status: DC

## 2018-11-26 MED ORDER — MIDAZOLAM HCL 2 MG/2ML IJ SOLN
INTRAMUSCULAR | Status: DC | PRN
  Administered 2018-11-26: 2 mg via INTRAVENOUS

## 2018-11-26 MED ORDER — TRANEXAMIC ACID-NACL 1000-0.7 MG/100ML-% IV SOLN
1000.0000 mg | INTRAVENOUS | Status: AC
  Administered 2018-11-26: 1000 mg via INTRAVENOUS
  Filled 2018-11-26: qty 100

## 2018-11-26 MED ORDER — FENTANYL CITRATE (PF) 100 MCG/2ML IJ SOLN
INTRAMUSCULAR | Status: AC
  Filled 2018-11-26: qty 2

## 2018-11-26 MED ORDER — CEFAZOLIN SODIUM-DEXTROSE 2-4 GM/100ML-% IV SOLN
2.0000 g | INTRAVENOUS | Status: AC
  Administered 2018-11-26: 2 g via INTRAVENOUS
  Filled 2018-11-26: qty 100

## 2018-11-26 MED ORDER — METOCLOPRAMIDE HCL 5 MG PO TABS: 5.0000 mg | ORAL_TABLET | Freq: Three times a day (TID) | ORAL | Status: DC | PRN

## 2018-11-26 MED ORDER — SODIUM CHLORIDE 0.9 % IR SOLN
Status: DC | PRN
  Administered 2018-11-26: 1000 mL

## 2018-11-26 MED ORDER — STERILE WATER FOR IRRIGATION IR SOLN
Status: DC | PRN
  Administered 2018-11-26: 2000 mL

## 2018-11-26 MED ORDER — PHENYLEPHRINE 40 MCG/ML (10ML) SYRINGE FOR IV PUSH (FOR BLOOD PRESSURE SUPPORT)
PREFILLED_SYRINGE | INTRAVENOUS | Status: DC | PRN
  Administered 2018-11-26 (×3): 120 ug via INTRAVENOUS

## 2018-11-26 MED ORDER — VANCOMYCIN HCL IN DEXTROSE 1-5 GM/200ML-% IV SOLN
1000.0000 mg | INTRAVENOUS | Status: AC
  Administered 2018-11-26: 1000 mg via INTRAVENOUS
  Filled 2018-11-26: qty 200

## 2018-11-26 MED ORDER — EPHEDRINE SULFATE-NACL 50-0.9 MG/10ML-% IV SOSY
PREFILLED_SYRINGE | INTRAVENOUS | Status: DC | PRN
  Administered 2018-11-26 (×2): 5 mg via INTRAVENOUS

## 2018-11-26 MED ORDER — ONDANSETRON HCL 4 MG/2ML IJ SOLN: 4.0000 mg | Freq: Four times a day (QID) | INTRAMUSCULAR | Status: DC | PRN

## 2018-11-26 MED ORDER — CEFAZOLIN SODIUM-DEXTROSE 1-4 GM/50ML-% IV SOLN
1.0000 g | Freq: Four times a day (QID) | INTRAVENOUS | Status: AC
  Administered 2018-11-26 (×2): 1 g via INTRAVENOUS
  Filled 2018-11-26 (×2): qty 50

## 2018-11-26 MED ORDER — METOCLOPRAMIDE HCL 5 MG/ML IJ SOLN: 5.0000 mg | Freq: Three times a day (TID) | INTRAMUSCULAR | Status: DC | PRN

## 2018-11-26 MED ORDER — DIPHENHYDRAMINE HCL 12.5 MG/5ML PO ELIX: 12.5000 mg | ORAL_SOLUTION | ORAL | Status: DC | PRN

## 2018-11-26 MED ORDER — PROPOFOL 10 MG/ML IV BOLUS
INTRAVENOUS | Status: AC
  Filled 2018-11-26: qty 80

## 2018-11-26 MED ORDER — SODIUM CHLORIDE 0.9 % IV SOLN
INTRAVENOUS | Status: DC
  Administered 2018-11-26: 75 mL/h via INTRAVENOUS
  Administered 2018-11-27: 02:00:00 via INTRAVENOUS

## 2018-11-26 MED ORDER — LACTATED RINGERS IV BOLUS: 500.0000 mL | Freq: Once | INTRAVENOUS | Status: AC

## 2018-11-26 MED ORDER — ASPIRIN 81 MG PO CHEW
81.0000 mg | CHEWABLE_TABLET | Freq: Two times a day (BID) | ORAL | Status: DC
  Administered 2018-11-26 – 2018-11-27 (×2): 81 mg via ORAL
  Filled 2018-11-26 (×2): qty 1

## 2018-11-26 MED ORDER — PROPOFOL 500 MG/50ML IV EMUL
INTRAVENOUS | Status: DC | PRN
  Administered 2018-11-26: 125 ug/kg/min via INTRAVENOUS

## 2018-11-26 MED ORDER — BUPIVACAINE IN DEXTROSE 0.75-8.25 % IT SOLN
INTRATHECAL | Status: DC | PRN
  Administered 2018-11-26: 1.6 mL via INTRATHECAL

## 2018-11-26 MED ORDER — ONDANSETRON HCL 4 MG/2ML IJ SOLN
INTRAMUSCULAR | Status: AC
  Filled 2018-11-26: qty 2

## 2018-11-26 MED ORDER — EPHEDRINE 5 MG/ML INJ
INTRAVENOUS | Status: AC
  Filled 2018-11-26: qty 10

## 2018-11-26 MED ORDER — PROPOFOL 10 MG/ML IV BOLUS
INTRAVENOUS | Status: DC | PRN
  Administered 2018-11-26: 10 mg via INTRAVENOUS
  Administered 2018-11-26: 20 mg via INTRAVENOUS

## 2018-11-26 MED ORDER — OXYCODONE HCL 5 MG PO TABS
5.0000 mg | ORAL_TABLET | ORAL | Status: DC | PRN
  Administered 2018-11-26: 5 mg via ORAL
  Administered 2018-11-27: 10 mg via ORAL
  Filled 2018-11-26: qty 1
  Filled 2018-11-26 (×2): qty 2

## 2018-11-26 MED ORDER — METHOCARBAMOL 500 MG IVPB - SIMPLE MED
500.0000 mg | Freq: Four times a day (QID) | INTRAVENOUS | Status: DC | PRN
  Filled 2018-11-26: qty 50

## 2018-11-26 MED ORDER — METHOCARBAMOL 500 MG IVPB - SIMPLE MED
INTRAVENOUS | Status: AC
  Filled 2018-11-26: qty 50

## 2018-11-26 MED ORDER — LORAZEPAM 1 MG PO TABS
1.0000 mg | ORAL_TABLET | Freq: Every day | ORAL | Status: DC
  Administered 2018-11-26: 1 mg via ORAL
  Filled 2018-11-26: qty 1

## 2018-11-26 MED ORDER — VANCOMYCIN HCL IN DEXTROSE 1-5 GM/200ML-% IV SOLN
1000.0000 mg | INTRAVENOUS | Status: AC
  Administered 2018-11-27: 1000 mg via INTRAVENOUS
  Filled 2018-11-26: qty 200

## 2018-11-26 MED ORDER — ONDANSETRON HCL 4 MG PO TABS: 4.0000 mg | ORAL_TABLET | Freq: Four times a day (QID) | ORAL | Status: DC | PRN

## 2018-11-26 MED ORDER — ONDANSETRON HCL 4 MG/2ML IJ SOLN
INTRAMUSCULAR | Status: DC | PRN
  Administered 2018-11-26: 4 mg via INTRAVENOUS

## 2018-11-26 MED ORDER — GLYCOPYRROLATE 0.2 MG/ML IJ SOLN
INTRAMUSCULAR | Status: DC | PRN
  Administered 2018-11-26 (×2): 0.1 mg via INTRAVENOUS

## 2018-11-26 MED ORDER — METHOCARBAMOL 500 MG PO TABS
500.0000 mg | ORAL_TABLET | Freq: Four times a day (QID) | ORAL | Status: DC | PRN
  Administered 2018-11-26: 500 mg via ORAL
  Filled 2018-11-26: qty 1

## 2018-11-26 MED ORDER — ACETAMINOPHEN 325 MG PO TABS: 325.0000 mg | ORAL_TABLET | Freq: Four times a day (QID) | ORAL | Status: DC | PRN

## 2018-11-26 MED ORDER — PHENYLEPHRINE 40 MCG/ML (10ML) SYRINGE FOR IV PUSH (FOR BLOOD PRESSURE SUPPORT)
PREFILLED_SYRINGE | INTRAVENOUS | Status: AC
  Filled 2018-11-26: qty 10

## 2018-11-26 MED ORDER — MENTHOL 3 MG MT LOZG: 1.0000 | LOZENGE | OROMUCOSAL | Status: DC | PRN

## 2018-11-26 MED ORDER — HYDROMORPHONE HCL 1 MG/ML IJ SOLN: 0.5000 mg | INTRAMUSCULAR | Status: DC | PRN

## 2018-11-26 MED ORDER — DEXAMETHASONE SODIUM PHOSPHATE 10 MG/ML IJ SOLN
INTRAMUSCULAR | Status: AC
  Filled 2018-11-26: qty 1

## 2018-11-26 MED ORDER — PANTOPRAZOLE SODIUM 40 MG PO TBEC
40.0000 mg | DELAYED_RELEASE_TABLET | Freq: Every day | ORAL | Status: DC
  Administered 2018-11-26 – 2018-11-27 (×2): 40 mg via ORAL
  Filled 2018-11-26 (×2): qty 1

## 2018-11-26 SURGICAL SUPPLY — 44 items
BAG ZIPLOCK 12X15 (MISCELLANEOUS) IMPLANT
BENZOIN TINCTURE PRP APPL 2/3 (GAUZE/BANDAGES/DRESSINGS) ×3 IMPLANT
BLADE SAW SGTL 18X1.27X75 (BLADE) ×2 IMPLANT
BLADE SAW SGTL 18X1.27X75MM (BLADE) ×1
BLADE SURG SZ10 CARB STEEL (BLADE) ×6 IMPLANT
CLOSURE WOUND 1/2 X4 (GAUZE/BANDAGES/DRESSINGS) ×1
COVER PERINEAL POST (MISCELLANEOUS) ×3 IMPLANT
COVER SURGICAL LIGHT HANDLE (MISCELLANEOUS) ×3 IMPLANT
COVER WAND RF STERILE (DRAPES) ×3 IMPLANT
DRAPE STERI IOBAN 125X83 (DRAPES) ×3 IMPLANT
DRAPE U-SHAPE 47X51 STRL (DRAPES) ×6 IMPLANT
DRSG AQUACEL AG ADV 3.5X10 (GAUZE/BANDAGES/DRESSINGS) ×3 IMPLANT
DURAPREP 26ML APPLICATOR (WOUND CARE) ×3 IMPLANT
ELECT REM PT RETURN 15FT ADLT (MISCELLANEOUS) ×3 IMPLANT
GAUZE XEROFORM 1X8 LF (GAUZE/BANDAGES/DRESSINGS) IMPLANT
GLOVE BIO SURGEON STRL SZ7.5 (GLOVE) ×3 IMPLANT
GLOVE BIOGEL PI IND STRL 7.0 (GLOVE) ×2 IMPLANT
GLOVE BIOGEL PI IND STRL 7.5 (GLOVE) ×2 IMPLANT
GLOVE BIOGEL PI IND STRL 8 (GLOVE) ×2 IMPLANT
GLOVE BIOGEL PI INDICATOR 7.0 (GLOVE) ×4
GLOVE BIOGEL PI INDICATOR 7.5 (GLOVE) ×4
GLOVE BIOGEL PI INDICATOR 8 (GLOVE) ×4
GLOVE ECLIPSE 8.0 STRL XLNG CF (GLOVE) ×3 IMPLANT
GLOVE SURG SS PI 7.0 STRL IVOR (GLOVE) ×3 IMPLANT
GOWN STRL REUS W/TWL XL LVL3 (GOWN DISPOSABLE) ×12 IMPLANT
HANDPIECE INTERPULSE COAX TIP (DISPOSABLE) ×2
HEAD CERAMIC DELTA 36 PLUS 1.5 (Hips) ×3 IMPLANT
HOLDER FOLEY CATH W/STRAP (MISCELLANEOUS) ×3 IMPLANT
LINER ACETAB NEUTRAL 36ID 520D (Liner) ×3 IMPLANT
PACK ANTERIOR HIP CUSTOM (KITS) ×3 IMPLANT
PIN SECTOR W/GRIP ACE CUP 52MM (Hips) ×3 IMPLANT
SCREW 6.5MMX25MM (Screw) ×3 IMPLANT
SET HNDPC FAN SPRY TIP SCT (DISPOSABLE) ×1 IMPLANT
STAPLER VISISTAT 35W (STAPLE) IMPLANT
STEM CORAIL KA10 (Stem) ×3 IMPLANT
STRIP CLOSURE SKIN 1/2X4 (GAUZE/BANDAGES/DRESSINGS) ×2 IMPLANT
SUT ETHIBOND NAB CT1 #1 30IN (SUTURE) ×3 IMPLANT
SUT MNCRL AB 4-0 PS2 18 (SUTURE) IMPLANT
SUT VIC AB 0 CT1 36 (SUTURE) ×3 IMPLANT
SUT VIC AB 1 CT1 36 (SUTURE) ×3 IMPLANT
SUT VIC AB 2-0 CT1 27 (SUTURE) ×4
SUT VIC AB 2-0 CT1 TAPERPNT 27 (SUTURE) ×2 IMPLANT
TRAY FOLEY MTR SLVR 16FR STAT (SET/KITS/TRAYS/PACK) ×3 IMPLANT
YANKAUER SUCT BULB TIP 10FT TU (MISCELLANEOUS) ×3 IMPLANT

## 2018-11-26 NOTE — Op Note (Signed)
NAMEELOHIM, VERMETTE MEDICAL RECORD EH:[id number] ACCOUNT [id number] DATE OF BIRTH:Jul 22, 1946 FACILITY: [facility] LOCATION: [facility] PHYSICIAN:Amarra Sawyer Aretha Parrot, MD  OPERATIVE REPORT  DATE OF PROCEDURE:  11/26/2018  PREOPERATIVE DIAGNOSIS:  Severe osteoarthritis and degenerative joint disease, left hip.  POSTOPERATIVE DIAGNOSIS:  Severe osteoarthritis and degenerative joint disease, left hip.  PROCEDURE:  Left total hip arthroplasty through direct anterior approach.  IMPLANTS:  DePuy Sector Gription acetabular component size 52 with a single screw, size 36+0 neutral polyethylene liner, size 10 Corail femoral component with standard offset, size 36+1.5 ceramic hip ball.  SURGEON:  Vanita Panda. Magnus Ivan, MD  ASSISTANT:  Richardean Canal, PA-C  ANESTHESIA:  Spinal.  ANTIBIOTICS:  One gram IV vancomycin.  ESTIMATED BLOOD LOSS:  150 mL.  COMPLICATIONS:  None.  INDICATIONS:  The patient is a 73 year old gentleman well known to me.  He actually has debilitating arthritis of both his hips.  He had a right total hip arthroplasty performed in 2017 and did well with this.  At this point, his left hip does show  severe arthritis.  His pain is daily and has detrimentally affected his mobility, his quality of life, and his activities of daily living to the point he does wish to proceed with a total hip arthroplasty on the left side.  At this point, he understands  fully, having had this done before, there is a risk of acute blood loss anemia, nerve or vessel injury, fracture, infection, dislocation, DVT.  He understands our goals are to decrease pain, improve mobility and overall improve quality of life.  DESCRIPTION OF PROCEDURE:  After informed consent was obtained and appropriate left hip was marked, he was brought to the operating room and sat up on a stretcher where spinal anesthesia was then obtained.  He was then laid in supine position on the  stretcher.  A Foley catheter was placed.  I was  able to assess his leg lengths and found that his leg lengths were equal.  We will try to match that.  We then placed traction boots on both his feet and placed him supine on the Hana fracture table, the  perineal post in place and both legs in in-line skeletal traction device and no traction applied.  I then assessed his leg length again and offset to get a preoperative film under C-arm.  We then prepped the left hip with DuraPrep and sterile drapes.  A  time-out was called, and he was identified as correct patient, correct left hip.  I then made an incision just inferior and posterior to the anterior superior iliac spine and carried this obliquely down the leg.  We dissected down the tensor fascia lata  muscle.  The tensor fascia was then divided longitudinally to proceed with direct anterior approach to the hip.  We identified and cauterized circumflex vessels.  I then identified the hip capsule, opened the hip capsule in an L-type format, finding a  moderate joint effusion and significant periarticular osteophytes around the femoral head and neck.  I placed Cobra retractors around the medial and lateral femoral neck and then made our femoral neck cut with an oscillating saw just proximal to the  lesser trochanter and completed this with an osteotome.  We placed a corkscrew guide in the femoral head and moved the femoral head in its entirety and found a wide area devoid of cartilage.  I then placed a bent Hohmann over the medial acetabular rim  and removed remnants of the acetabular labrum and other debris from  the acetabulum.  We then began reaming under direct visualization in stepwise increments from a size 44 reamer, going all the way up to a size 51 with all reamers under direct  visualization, the last reamer under direct fluoroscopy so I could obtain my depth of reaming, my inclination and anteversion.  I then placed the real DePuy Sector Gription acetabular component size 52, and I did place a  single screw due to a slight  overhang of the cup even though it was tight feeling.  I then placed a 32+0 polyethylene liner for that size acetabular component.  Attention was then turned to the femur.  With the leg externally rotated to 120 degrees, extended and adducted, I was able  to place a Mueller retractor medially and a Hohmann retractor behind the greater trochanter.  I released the lateral joint capsule and used a box-cutting osteotome to enter the femoral canal and a rongeur to lateralize.  I then began broaching from a  size 8 broach, going up to only a size 10, and this actually corresponded with his other side.  It was very tight canal.  With a size 10 in place, we trialed a standard offset femoral neck and a 36+1.5 trial hip ball and reduced this in the acetabulum.   I was pleased with his range of motion and stability, leg length and offset assessed clinically as well as radiographically.  We then dislocated the hip and removed the trial components.  I placed the real Corail femoral component size 10 with standard  offset and the real 36+1.5 ceramic hip ball and again reduced this in the acetabulum, and I was pleased with stability.  We then again assessed it radiographically as well.  We then closed the joint capsule with interrupted #1 Ethibond suture, followed  by running 0 Vicryl in the tensor fascia, 0 Vicryl in the deep tissue, 2-0 Vicryl subcutaneous tissue, 4-0 Monocryl subcuticular stitch and Steri-Strips on the skin.  An Aquacel dressing was applied.  He was taken off the Hana table and taken to recovery  room in stable condition.  All final counts were correct.  There were no complications noted.  Of note, Rexene EdisonGil Clark, PA-C, assisted the entire case.  His assistance was crucial for facilitating all aspects of this case.  LN/NUANCE  D:11/26/2018 T:11/26/2018 JOB:[id number]

## 2018-11-26 NOTE — Anesthesia Postprocedure Evaluation (Signed)
Anesthesia Post Note  Patient: Rockne Menghini  Procedure(s) Performed: LEFT TOTAL HIP ARTHROPLASTY ANTERIOR APPROACH (Left Hip)     Patient location during evaluation: PACU Anesthesia Type: Spinal Level of consciousness: oriented and awake and alert Pain management: pain level controlled Vital Signs Assessment: post-procedure vital signs reviewed and stable Respiratory status: spontaneous breathing, respiratory function stable and patient connected to nasal cannula oxygen Cardiovascular status: blood pressure returned to baseline and stable Postop Assessment: no headache, no backache and no apparent nausea or vomiting Anesthetic complications: no    Last Vitals:  Vitals:   11/26/18 1429 11/26/18 1527  BP: 106/76 120/87  Pulse: 88 98  Resp: 14 16  Temp: 36.5 C 36.8 C  SpO2: 93% 92%    Last Pain:  Vitals:   11/26/18 1527  TempSrc: Oral  PainSc:                  Chelsey L Woodrum

## 2018-11-26 NOTE — Anesthesia Procedure Notes (Signed)
Procedure Name: MAC Date/Time: 11/26/2018 8:32 AM Performed by: Niel Hummer, CRNA Pre-anesthesia Checklist: Patient identified, Emergency Drugs available, Suction available and Patient being monitored Patient Re-evaluated:Patient Re-evaluated prior to induction Oxygen Delivery Method: Simple face mask

## 2018-11-26 NOTE — Anesthesia Procedure Notes (Signed)
Spinal  Patient location during procedure: OR Start time: 11/26/2018 8:29 AM End time: 11/26/2018 8:39 AM Staffing Anesthesiologist: Elmer Picker, MD Performed: anesthesiologist  Preanesthetic Checklist Completed: patient identified, surgical consent, pre-op evaluation, timeout performed, IV checked, risks and benefits discussed and monitors and equipment checked Spinal Block Patient position: sitting Prep: site prepped and draped and DuraPrep Patient monitoring: cardiac monitor, continuous pulse ox and blood pressure Approach: midline Location: L3-4 Injection technique: single-shot Needle Needle type: Pencan  Needle gauge: 24 G Needle length: 9 cm Assessment Sensory level: T6 Additional Notes Functioning IV was confirmed and monitors were applied. Sterile prep and drape, including hand hygiene and sterile gloves were used. The patient was positioned and the spine was prepped. The skin was anesthetized with lidocaine.  Free flow of clear CSF was obtained prior to injecting local anesthetic into the CSF.  The spinal needle aspirated freely following injection.  The needle was carefully withdrawn.  The patient tolerated the procedure well.

## 2018-11-26 NOTE — Evaluation (Signed)
Physical Therapy Evaluation Patient Details Name: Derek Velez MRN: [id number] DOB: 1946/08/21 Today's Date: 11/26/2018   History of Present Illness  Pt s/p L THR and with hx of R THR(17)  Clinical Impression  Pt s/p L THR and presents with decreased L LE strength/ROM and post op pain limiting functional mobility.  Pt should progress to dc home with family assist.    Follow Up Recommendations Follow surgeon's recommendation for DC plan and follow-up therapies    Equipment Recommendations  None recommended by PT    Recommendations for Other Services       Precautions / Restrictions Precautions Precautions: Fall Restrictions Weight Bearing Restrictions: No Other Position/Activity Restrictions: WBAT      Mobility  Bed Mobility Overal bed mobility: Needs Assistance Bed Mobility: Supine to Sit     Supine to sit: Min guard     General bed mobility comments: min cues for sequence and use of R LE to self assist  Transfers Overall transfer level: Needs assistance Equipment used: Rolling walker (2 wheeled) Transfers: Sit to/from Stand Sit to Stand: Min assist         General transfer comment: cues for LE management, use of UEs to self assist.  Steady assist on rising  Ambulation/Gait Ambulation/Gait assistance: Min assist Gait Distance (Feet): 140 Feet Assistive device: Rolling walker (2 wheeled) Gait Pattern/deviations: Step-to pattern;Step-through pattern;Decreased step length - right;Decreased step length - left;Shuffle Gait velocity: decr   General Gait Details: cues for posture, position from RW and initial sequence  Stairs            Wheelchair Mobility    Modified Rankin (Stroke Patients Only)       Balance Overall balance assessment: Mild deficits observed, not formally tested                                           Pertinent Vitals/Pain Pain Assessment: 0-10 Pain Score: 4  Pain Location: L hip Pain Descriptors /  Indicators: Aching;Sore Pain Intervention(s): Limited activity within patient's tolerance;Monitored during session;Premedicated before session;Ice applied    Home Living Family/patient expects to be discharged to:: Private residence Living Arrangements: Spouse/significant other Available Help at Discharge: Family Type of Home: House Home Access: Stairs to enter   Secretary/administrator of Steps: 1 Home Layout: One level Home Equipment: Environmental consultant - 2 wheels;Bedside commode;Cane - single point      Prior Function Level of Independence: Independent               Hand Dominance        Extremity/Trunk Assessment   Upper Extremity Assessment Upper Extremity Assessment: Overall WFL for tasks assessed    Lower Extremity Assessment Lower Extremity Assessment: LLE deficits/detail    Cervical / Trunk Assessment Cervical / Trunk Assessment: Normal  Communication   Communication: No difficulties  Cognition Arousal/Alertness: Awake/alert Behavior During Therapy: WFL for tasks assessed/performed Overall Cognitive Status: Within Functional Limits for tasks assessed                                        General Comments      Exercises Total Joint Exercises Ankle Circles/Pumps: AAROM;Left;Supine   Assessment/Plan    PT Assessment Patient needs continued PT services  PT Problem List Decreased strength;Decreased range of motion;Decreased activity tolerance;Decreased  mobility;Decreased knowledge of use of DME;Pain       PT Treatment Interventions DME instruction;Gait training;Stair training;Functional mobility training;Therapeutic activities;Therapeutic exercise;Patient/family education    PT Goals (Current goals can be found in the Care Plan section)  Acute Rehab PT Goals Patient Stated Goal: Regain IND PT Goal Formulation: With patient Time For Goal Achievement: 12/03/18 Potential to Achieve Goals: Good    Frequency 7X/week   Barriers to discharge         Co-evaluation               AM-PAC PT "6 Clicks" Mobility  Outcome Measure Help needed turning from your back to your side while in a flat bed without using bedrails?: A Little Help needed moving from lying on your back to sitting on the side of a flat bed without using bedrails?: A Little Help needed moving to and from a bed to a chair (including a wheelchair)?: A Little Help needed standing up from a chair using your arms (e.g., wheelchair or bedside chair)?: A Little Help needed to walk in hospital room?: A Little Help needed climbing 3-5 steps with a railing? : A Little 6 Click Score: 18    End of Session Equipment Utilized During Treatment: Gait belt Activity Tolerance: Patient tolerated treatment well Patient left: in chair;with call bell/phone within reach;with family/visitor present;with chair alarm set Nurse Communication: Mobility status PT Visit Diagnosis: Difficulty in walking, not elsewhere classified (R26.2)    Time: 6063-0160 PT Time Calculation (min) (ACUTE ONLY): 17 min   Charges:   PT Evaluation $PT Eval Low Complexity: 1 Low          Mauro Kaufmann PT Acute Rehabilitation Services Pager [phone] Office [phone]   Addilee Neu 11/26/2018, 5:36 PM

## 2018-11-26 NOTE — Brief Op Note (Signed)
11/26/2018  9:48 AM  PATIENT:  Derek Velez  73 y.o. male  PRE-OPERATIVE DIAGNOSIS:  osteoarthritis left hip  POST-OPERATIVE DIAGNOSIS:  osteoarthritis left hip  PROCEDURE:  Procedure(s): LEFT TOTAL HIP ARTHROPLASTY ANTERIOR APPROACH (Left)  SURGEON:  Surgeon(s) and Role:    Kathryne Hitch, MD - Primary  PHYSICIAN ASSISTANT: Rexene Edison, PA-C assisted  ANESTHESIA:   spinal  EBL:  200 mL   COUNTS:  YES  TOURNIQUET:  * No tourniquets in log *  DICTATION: .Other Dictation: Dictation Number [id number]  PLAN OF CARE: Admit to inpatient   PATIENT DISPOSITION:  PACU - hemodynamically stable.   Delay start of Pharmacological VTE agent (>24hrs) due to surgical blood loss or risk of bleeding: no

## 2018-11-26 NOTE — Transfer of Care (Signed)
Immediate Anesthesia Transfer of Care Note  Patient: Derek Velez  Procedure(s) Performed: LEFT TOTAL HIP ARTHROPLASTY ANTERIOR APPROACH (Left Hip)  Patient Location: PACU  Anesthesia Type:Spinal  Level of Consciousness: awake, alert  and oriented  Airway & Oxygen Therapy: Patient Spontanous Breathing and Patient connected to face mask oxygen  Post-op Assessment: Report given to RN and Post -op Vital signs reviewed and stable  Post vital signs: Reviewed and stable  Last Vitals:  Vitals Value Taken Time  BP 87/60 11/26/2018 10:03 AM  Temp    Pulse 73 11/26/2018 10:06 AM  Resp 21 11/26/2018 10:06 AM  SpO2 97 % 11/26/2018 10:06 AM  Vitals shown include unvalidated device data.  Last Pain:  Vitals:   11/26/18 0655  TempSrc: Oral  PainSc:       Patients Stated Pain Goal: 4 (11/26/18 0654)  Complications: No apparent anesthesia complications

## 2018-11-26 NOTE — H&P (Signed)
TOTAL HIP ADMISSION H&P  Patient is admitted for left total hip arthroplasty.  Subjective:  Chief Complaint: left hip pain  HPI: Derek Velez, 73 y.o. male, has a history of pain and functional disability in the left hip(s) due to arthritis and patient has failed non-surgical conservative treatments for greater than 12 weeks to include NSAID's and/or analgesics, corticosteriod injections, flexibility and strengthening excercises, use of assistive devices and activity modification.  Onset of symptoms was gradual starting 2 years ago with gradually worsening course since that time.The patient noted no past surgery on the left hip(s).  Patient currently rates pain in the left hip at 10 out of 10 with activity. Patient has night pain, worsening of pain with activity and weight bearing, pain that interfers with activities of daily living, pain with passive range of motion and crepitus. Patient has evidence of subchondral sclerosis, periarticular osteophytes and joint space narrowing by imaging studies. This condition presents safety issues increasing the risk of falls.  There is no current active infection.  Patient Active Problem List   Diagnosis Date Noted  . Unilateral primary osteoarthritis, left hip 11/01/2018  . Osteoarthritis of right hip 06/06/2016  . Status post total replacement of right hip 06/06/2016   Past Medical History:  Diagnosis Date  . Anxiety    2002  . Arthritis   . Depression    2002  . GERD (gastroesophageal reflux disease)   . History of hiatal hernia     Past Surgical History:  Procedure Laterality Date  . CHOLECYSTECTOMY    . ROTATOR CUFF REPAIR     both shoulders  . TOTAL HIP ARTHROPLASTY Right 06/06/2016   Procedure: RIGHT TOTAL HIP ARTHROPLASTY ANTERIOR APPROACH;  Surgeon: Kathryne Hitch, MD;  Location: [facility];  Service: Orthopedics;  Laterality: Right;    Current Facility-Administered Medications  Medication Dose Route Frequency Provider Last Rate  Last Dose  . ceFAZolin (ANCEF) IVPB 2g/100 mL premix  2 g Intravenous On Call to OR Kirtland Bouchard, PA-C      . chlorhexidine (HIBICLENS) 4 % liquid 4 application  60 mL Topical Once Richardean Canal W, PA-C      . lactated ringers infusion   Intravenous Continuous Elmer Picker, MD 50 mL/hr at 11/26/18 0703    . tranexamic acid (CYKLOKAPRON) IVPB 1,000 mg  1,000 mg Intravenous To OR Kirtland Bouchard, PA-C      . vancomycin (VANCOCIN) IVPB 1000 mg/200 mL premix  1,000 mg Intravenous On Call to OR Kathryne Hitch, MD       Allergies  Allergen Reactions  . Banana Other (See Comments)    Severe headache  . Sulfa Antibiotics Rash    Social History   Tobacco Use  . Smoking status: Former Smoker    Last attempt to quit: 05/30/2003    Years since quitting: 15.5  . Smokeless tobacco: Current User    Types: Snuff  Substance Use Topics  . Alcohol use: No    History reviewed. No pertinent family history.   Review of Systems  Musculoskeletal: Positive for joint pain.  All other systems reviewed and are negative.   Objective:  Physical Exam  Constitutional: He is oriented to person, place, and time. He appears well-developed and well-nourished.  HENT:  Head: Normocephalic and atraumatic.  Eyes: Pupils are equal, round, and reactive to light. EOM are normal.  Neck: Normal range of motion. Neck supple.  Cardiovascular: Normal rate.  Respiratory: Effort normal and breath sounds normal.  GI: Soft. Bowel sounds are normal.  Musculoskeletal:     Left hip: He exhibits decreased range of motion, decreased strength, tenderness and bony tenderness.  Neurological: He is alert and oriented to person, place, and time.  Skin: Skin is warm and dry.  Psychiatric: He has a normal mood and affect.    Vital signs in last 24 hours: Temp:  [97.6 F (36.4 C)] 97.6 F (36.4 C) (01/31 0655) Pulse Rate:  [68] 68 (01/31 0655) Resp:  [18] 18 (01/31 0655) BP: (159)/(100) 159/100 (01/31  0655) SpO2:  [96 %] 96 % (01/31 0655) Weight:  [78 kg] 78 kg (01/31 0654)  Labs:   Estimated body mass index is 25.4 kg/m as calculated from the following:   Height as of this encounter: 5\' 9"  (1.753 m).   Weight as of this encounter: 78 kg.   Imaging Review Plain radiographs demonstrate severe degenerative joint disease of the left hip(s). The bone quality appears to be good for age and reported activity level.    Preoperative templating of the joint replacement has been completed, documented, and submitted to the Operating Room personnel in order to optimize intra-operative equipment management.     Assessment/Plan:  End stage arthritis, left hip(s)  The patient history, physical examination, clinical judgement of the provider and imaging studies are consistent with end stage degenerative joint disease of the left hip(s) and total hip arthroplasty is deemed medically necessary. The treatment options including medical management, injection therapy, arthroscopy and arthroplasty were discussed at length. The risks and benefits of total hip arthroplasty were presented and reviewed. The risks due to aseptic loosening, infection, stiffness, dislocation/subluxation,  thromboembolic complications and other imponderables were discussed.  The patient acknowledged the explanation, agreed to proceed with the plan and consent was signed. Patient is being admitted for inpatient treatment for surgery, pain control, PT, OT, prophylactic antibiotics, VTE prophylaxis, progressive ambulation and ADL's and discharge planning.The patient is planning to be discharged home with home health services

## 2018-11-27 DIAGNOSIS — M1612 Unilateral primary osteoarthritis, left hip: Secondary | ICD-10-CM | POA: Diagnosis not present

## 2018-11-27 LAB — BASIC METABOLIC PANEL
Anion gap: 8 (ref 5–15)
BUN: 18 mg/dL (ref 8–23)
CO2: 24 mmol/L (ref 22–32)
Calcium: 8.3 mg/dL — ABNORMAL LOW (ref 8.9–10.3)
Chloride: 102 mmol/L (ref 98–111)
Creatinine, Ser: 0.77 mg/dL (ref 0.61–1.24)
GFR calc Af Amer: >60 mL/min (ref >60)
GLUCOSE: 132 mg/dL — AB (ref 70–99)
Potassium: 3.9 mmol/L (ref 3.5–5.1)
SODIUM: 134 mmol/L — AB (ref 135–145)

## 2018-11-27 LAB — CBC
HCT: 40.3 % (ref 39.0–52.0)
Hemoglobin: 13.4 g/dL (ref 13.0–17.0)
MCH: 32.4 pg (ref 26.0–34.0)
MCHC: 33.3 g/dL (ref 30.0–36.0)
MCV: 97.6 fL (ref 80.0–100.0)
Platelets: 194 10*3/uL (ref 150–400)
RBC: 4.13 MIL/uL — ABNORMAL LOW (ref 4.22–5.81)
RDW: 13.2 % (ref 11.5–15.5)
WBC: 14.2 10*3/uL — ABNORMAL HIGH (ref 4.0–10.5)
nRBC: 0 % (ref 0.0–0.2)

## 2018-11-27 MED ORDER — METHOCARBAMOL 500 MG PO TABS: 500.0000 mg | ORAL_TABLET | Freq: Four times a day (QID) | ORAL | 0 refills | Status: DC | PRN

## 2018-11-27 MED ORDER — OXYCODONE HCL 5 MG PO TABS: 5.0000 mg | ORAL_TABLET | ORAL | 0 refills | Status: DC | PRN

## 2018-11-27 MED ORDER — ASPIRIN 81 MG PO CHEW: 81.0000 mg | CHEWABLE_TABLET | Freq: Two times a day (BID) | ORAL | 0 refills | Status: AC

## 2018-11-27 NOTE — Plan of Care (Signed)
Pt stable with no needs. No changes to note at time of assessment this am. No changes needed to care plans. Family at bedside.

## 2018-11-27 NOTE — Discharge Instructions (Signed)

## 2018-11-27 NOTE — Progress Notes (Signed)
   Subjective: 1 Day Post-Op Procedure(s) (LRB): LEFT TOTAL HIP ARTHROPLASTY ANTERIOR APPROACH (Left) Patient reports pain as mild.  Walked in halls , already did stairs. " I have a whole bottle of percocet at home and also robaxin for my back, I will not need to take either, I just used tylenol after my other surgery "  Objective: Vital signs in last 24 hours: Temp:  [97.5 F (36.4 C)-98.3 F (36.8 C)] 97.6 F (36.4 C) (02/01 1045) Pulse Rate:  [64-98] 73 (02/01 1045) Resp:  [10-18] 15 (02/01 1045) BP: (91-120)/(73-87) 117/76 (02/01 1045) SpO2:  [92 %-96 %] 92 % (02/01 1045) Weight:  [78 kg] 78 kg (01/31 1225)  Intake/Output from previous day: 01/31 0701 - 02/01 0700 In: 2034.8 [I.V.:1834.8; IV Piggyback:200] Out: 2240 [Urine:2040; Blood:200] Intake/Output this shift: Total I/O In: 1025.3 [P.O.:600; I.V.:225.3; IV Piggyback:200] Out: -   Recent Labs    11/27/18 0458  HGB 13.4   Recent Labs    11/27/18 0458  WBC 14.2*  RBC 4.13*  HCT 40.3  PLT 194   Recent Labs    11/27/18 0458  NA 134*  K 3.9  CL 102  CO2 24  BUN 18  CREATININE 0.77  GLUCOSE 132*  CALCIUM 8.3*   No results for input(s): LABPT, INR in the last 72 hours.  Neurologically intact No results found.  Assessment/Plan: 1 Day Post-Op Procedure(s) (LRB): LEFT TOTAL HIP ARTHROPLASTY ANTERIOR APPROACH (Left) Plan: discharge home. Office Dr. Magnus Ivan 2 wks.  Already has appt Eldred Manges 11/27/2018, 11:27 AM

## 2018-11-27 NOTE — Care Management Note (Signed)
Case Management Note  Patient Details  Name: Derek Velez MRN: [id number] Date of Birth: Nov 14, 1945  Subjective/Objective:    S/p L THR                Action/Plan: NCM spoke to pt and offered choice for [facility] (preoperatively arranged from surgeon's office). Pt arranged with [facility]. Pt reports having RW and high rise toilet at home. Wife will assist at home.   Expected Discharge Date:  11/27/18               Expected Discharge Plan:  Home w Home Health Services  In-House Referral:  NA  Discharge planning Services  CM Consult  Post Acute Care Choice:  Home Health Choice offered to:  Patient  DME Arranged:  N/A DME Agency:  NA  HH Arranged:  PT HH Agency:  [facility]  Status of Service:  Completed, signed off  If discussed at [employer], dates discussed:    Additional Comments:  Elliot Cousin, RN 11/27/2018, 4:18 PM

## 2018-11-27 NOTE — Plan of Care (Signed)
  Problem: Clinical Measurements: Goal: Ability to maintain clinical measurements within normal limits will improve Outcome: Progressing   Problem: Activity: Goal: Risk for activity intolerance will decrease Outcome: Progressing   Problem: Nutrition: Goal: Adequate nutrition will be maintained Outcome: Progressing   Problem: Coping: Goal: Level of anxiety will decrease Outcome: Progressing   Problem: Education: Goal: Knowledge of the prescribed therapeutic regimen will improve Outcome: Progressing   Problem: Education: Goal: Understanding of discharge needs will improve Outcome: Progressing   Problem: Activity: Goal: Ability to avoid complications of mobility impairment will improve Outcome: Progressing   Problem: Activity: Goal: Ability to tolerate increased activity will improve Outcome: Progressing   Problem: Pain Management: Goal: Pain level will decrease with appropriate interventions Outcome: Progressing

## 2018-11-27 NOTE — Evaluation (Signed)
Occupational Therapy Evaluation Patient Details Name: Derek Velez MRN: [id number] DOB: 1945-11-13 Today's Date: 11/27/2018    History of Present Illness Pt s/p L THR and with hx of R THR(17)   Clinical Impression   OT education complete including ADL activity s/p THR    Follow Up Recommendations  No OT follow up    Equipment Recommendations  None recommended by OT       Precautions / Restrictions Precautions Precautions: Fall Restrictions Weight Bearing Restrictions: No Other Position/Activity Restrictions: WBAT      Mobility Bed Mobility Overal bed mobility: Needs Assistance Bed Mobility: Supine to Sit     Supine to sit: Supervision     General bed mobility comments: Pt OOB  Transfers Overall transfer level: Needs assistance Equipment used: Rolling walker (2 wheeled) Transfers: Sit to/from [employer] Sit to Stand: Supervision Stand pivot transfers: Min guard       General transfer comment: cues for LE management, use of UEs to self assist.     Balance Overall balance assessment: Mild deficits observed, not formally tested                                         ADL either performed or assessed with clinical judgement   ADL Overall ADL's : Needs assistance/impaired Eating/Feeding: Set up;Sitting   Grooming: Set up;Standing   Upper Body Bathing: Set up;Sitting   Lower Body Bathing: Minimal assistance;Sit to/from stand;Cueing for sequencing;Cueing for safety   Upper Body Dressing : Sitting   Lower Body Dressing: Minimal assistance;Sit to/from stand;Cueing for sequencing;Cueing for safety   Toilet Transfer: Min guard;Ambulation;Comfort height toilet   Toileting- Clothing Manipulation and Hygiene: Min guard;Sit to/from stand;Cueing for sequencing;Cueing for safety   Tub/ Shower Transfer: Tub Metallurgist Details (indicate cue type and reason): verbalized safety Functional mobility during ADLs:  Supervision/safety;Cueing for sequencing;Cueing for safety General ADL Comments: wife will A as needed     Vision Patient Visual Report: No change from baseline              Pertinent Vitals/Pain Pain Assessment: 0-10 Pain Score: 3  Pain Location: L hip Pain Descriptors / Indicators: Discomfort;Sore Pain Intervention(s): Limited activity within patient's tolerance;Repositioned     Hand Dominance     Extremity/Trunk Assessment Upper Extremity Assessment Upper Extremity Assessment: Overall WFL for tasks assessed           Communication Communication Communication: No difficulties   Cognition Arousal/Alertness: Awake/alert Behavior During Therapy: WFL for tasks assessed/performed Overall Cognitive Status: Within Functional Limits for tasks assessed                                                Home Living Family/patient expects to be discharged to:: Private residence Living Arrangements: Spouse/significant other Available Help at Discharge: Family Type of Home: House Home Access: Stairs to enter Secretary/administrator of Steps: 1   Home Layout: One level     Bathroom Shower/Tub: Tub/shower unit         Home Equipment: Environmental consultant - 2 wheels;Bedside commode;Cane - single point          Prior Functioning/Environment Level of Independence: Independent  OT Goals(Current goals can be found in the care plan section) Acute Rehab OT Goals Patient Stated Goal: Regain IND  OT Frequency:      AM-PAC OT "6 Clicks" Daily Activity     Outcome Measure Help from another person eating meals?: None Help from another person taking care of personal grooming?: None Help from another person toileting, which includes using toliet, bedpan, or urinal?: A Little Help from another person bathing (including washing, rinsing, drying)?: A Little Help from another person to put on and taking off regular upper body clothing?:  None Help from another person to put on and taking off regular lower body clothing?: A Little 6 Click Score: 21   End of Session Equipment Utilized During Treatment: Rolling walker Nurse Communication: Mobility status  Activity Tolerance: Patient tolerated treatment well Patient left: in chair                   Time: 1050-1106 OT Time Calculation (min): 16 min Charges:  OT General Charges $OT Visit: 1 Visit OT Evaluation $OT Eval Low Complexity: 1 Low  Lise Auer, OT Acute Rehabilitation Services [phone] Office- [phone]     Kiwan Gadsden, Karin Golden D 11/27/2018, 2:15 PM

## 2018-11-27 NOTE — Discharge Summary (Signed)
Patient ID: Derek Velez MRN: [id number] DOB/AGE: 1946/07/27 73 y.o.  Admit date: 11/26/2018 Discharge date: 11/27/2018  Admission Diagnoses:  Principal Problem:   Unilateral primary osteoarthritis, left hip Active Problems:   Status post total replacement of left hip   Discharge Diagnoses:  Same  Past Medical History:  Diagnosis Date  . Anxiety    2002  . Arthritis   . Depression    2002  . GERD (gastroesophageal reflux disease)   . History of hiatal hernia     Surgeries: Procedure(s): LEFT TOTAL HIP ARTHROPLASTY ANTERIOR APPROACH on 11/26/2018   Consultants:   Discharged Condition: Improved  Hospital Course: Derek Velez is an 73 y.o. male who was admitted 11/26/2018 for operative treatment ofUnilateral primary osteoarthritis, left hip. Patient has severe unremitting pain that affects sleep, daily activities, and work/hobbies. After pre-op clearance the patient was taken to the operating room on 11/26/2018 and underwent  Procedure(s): LEFT TOTAL HIP ARTHROPLASTY ANTERIOR APPROACH.    Patient was given perioperative antibiotics:  Anti-infectives (From admission, onward)   Start     Dose/Rate Route Frequency Ordered Stop   11/27/18 0800  vancomycin (VANCOCIN) IVPB 1000 mg/200 mL premix     1,000 mg 200 mL/hr over 60 Minutes Intravenous Every 24 hours 11/26/18 1234 11/27/18 1002   11/26/18 1400  ceFAZolin (ANCEF) IVPB 1 g/50 mL premix     1 g 100 mL/hr over 30 Minutes Intravenous Every 6 hours 11/26/18 1234 11/26/18 2020   11/26/18 0645  vancomycin (VANCOCIN) IVPB 1000 mg/200 mL premix     1,000 mg 200 mL/hr over 60 Minutes Intravenous On call to O.R. 11/26/18 9528 11/26/18 0830   11/26/18 0645  ceFAZolin (ANCEF) IVPB 2g/100 mL premix     2 g 200 mL/hr over 30 Minutes Intravenous On call to O.R. 11/26/18 4132 11/26/18 4401       Patient was given sequential compression devices, early ambulation, and chemoprophylaxis to prevent DVT.  Patient benefited maximally from  hospital stay and there were no complications.    Recent vital signs:  Patient Vitals for the past 24 hrs:  BP Temp Temp src Pulse Resp SpO2 Height Weight  11/27/18 1045 117/76 97.6 F (36.4 C) Oral 73 15 92 % - -  11/27/18 0613 115/78 98.3 F (36.8 C) Oral 68 16 92 % - -  11/26/18 2105 115/75 98.1 F (36.7 C) Oral 79 16 96 % - -  11/26/18 1527 120/87 98.3 F (36.8 C) Oral 98 16 92 % - -  11/26/18 1429 106/76 97.7 F (36.5 C) Oral 88 14 93 % - -  11/26/18 1320 107/73 (!) 97.5 F (36.4 C) - 75 16 95 % - -  11/26/18 1225 112/76 97.6 F (36.4 C) Oral 64 12 93 % 5\' 9"  (1.753 m) 78 kg  11/26/18 1200 110/78 97.6 F (36.4 C) - 64 10 94 % - -     Recent laboratory studies:  Recent Labs    11/27/18 0458  WBC 14.2*  HGB 13.4  HCT 40.3  PLT 194  NA 134*  K 3.9  CL 102  CO2 24  BUN 18  CREATININE 0.77  GLUCOSE 132*  CALCIUM 8.3*     Discharge Medications:   Allergies as of 11/27/2018      Reactions   Banana Other (See Comments)   Severe headache   Sulfa Antibiotics Rash      Medication List    TAKE these medications   aspirin 81 MG chewable tablet Chew  1 tablet (81 mg total) by mouth 2 (two) times daily.   diphenhydrAMINE 25 MG tablet Commonly known as:  BENADRYL Take 50 mg by mouth at bedtime.   LORazepam 1 MG tablet Commonly known as:  ATIVAN Take 1 mg by mouth at bedtime.   meloxicam 7.5 MG tablet Commonly known as:  MOBIC Take 7.5 mg by mouth daily as needed for pain.   omeprazole 20 MG capsule Commonly known as:  PRILOSEC Take 20 mg by mouth every evening.            Durable Medical Equipment  (From admission, onward)         Start     Ordered   11/26/18 1235  DME 3 n 1  Once     11/26/18 1234   11/26/18 1235  DME Walker rolling  Once    Question:  Patient needs a walker to treat with the following condition  Answer:  Status post total replacement of left hip   11/26/18 1234          Diagnostic Studies: Dg Pelvis Portable  Result  Date: 11/26/2018 CLINICAL DATA:  Status post left hip replacement. EXAM: PORTABLE PELVIS 1-2 VIEWS COMPARISON:  Radiographs of November 01, 2018. FINDINGS: The left femoral and acetabular components appear to be well situated. Expected postoperative changes are seen in the surrounding soft tissues. 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 IMPRESSION: Interval placement of left total hip arthroplasty. Electronically Signed   By: Lupita Raider, M.D.   On: 11/26/2018 11:39   Dg C-arm 1-60 Min-no Report  Result Date: 11/26/2018 Fluoroscopy was utilized by the requesting physician.  No radiographic interpretation.   Dg Hip Operative Unilat W Or W/o Pelvis Left  Result Date: 11/26/2018 CLINICAL DATA:  Left hip replacement. EXAM: OPERATIVE LEFT HIP (WITH PELVIS IF PERFORMED) 2 VIEWS TECHNIQUE: Fluoroscopic spot image(s) were submitted for interpretation post-operatively. COMPARISON:  No recent prior. FINDINGS: Total left hip replacement. Hardware intact. Anatomic alignment. Prior right hip replacement. IMPRESSION: Total left hip replacement anatomic alignment. Electronically Signed   By: Maisie Fus  Register   On: 11/26/2018 10:07   Xr Hip Unilat W Or W/o Pelvis 2-3 Views Left  Result Date: 11/01/2018 An AP pelvis and lateral of the left hip show severe end-stage arthritis of left hip.  There are large para-articular osteophytes as well as joint space narrowing.  There are sclerotic changes in the femoral head and acetabulum.  There is a right total hip arthroplasty with no complicating features.   Disposition: Discharge disposition: 01-Home or Self Care         Follow-up Information    [facility] Follow up.   Specialty:  Home Health Services Why:  Home Health Physical Therapy-agency will call to arrange visit Contact information: [address] [place] [address] [place] [address] [phone]        Kathryne Hitch, MD Follow up in 2 week(s).   Specialty:  Orthopedic  Surgery Contact information: [address] [place] [address] [phone]            Signed: Kathryne Hitch 11/27/2018, 11:57 AM

## 2018-11-27 NOTE — Plan of Care (Signed)
Pt dc home with family in stable condition. No needs at time of d/c. Pt had all home equipment needed.

## 2018-11-27 NOTE — Progress Notes (Signed)
Physical Therapy Treatment Patient Details Name: Derek MenghiniBruce Velez MRN: [id number] DOB: July 13, 1946 Today's Date: 11/27/2018    History of Present Illness Pt s/p L THR and with hx of R THR(17)    PT Comments    Pt progressing well with mobility and eager for dc home.  Reviewed car transfers, stairs and home therex program with written instruction provided.   Follow Up Recommendations  Follow surgeon's recommendation for DC plan and follow-up therapies     Equipment Recommendations  None recommended by PT    Recommendations for Other Services       Precautions / Restrictions Precautions Precautions: Fall Restrictions Weight Bearing Restrictions: No Other Position/Activity Restrictions: WBAT    Mobility  Bed Mobility Overal bed mobility: Needs Assistance Bed Mobility: Supine to Sit     Supine to sit: Supervision     General bed mobility comments: min cues for sequence and use of R LE to self assist  Transfers Overall transfer level: Needs assistance Equipment used: Rolling walker (2 wheeled) Transfers: Sit to/from Stand Sit to Stand: Supervision         General transfer comment: cues for LE management, use of UEs to self assist.   Ambulation/Gait Ambulation/Gait assistance: Min guard;Supervision Gait Distance (Feet): 140 Feet Assistive device: Rolling walker (2 wheeled) Gait Pattern/deviations: Step-to pattern;Step-through pattern;Decreased step length - right;Decreased step length - left;Shuffle Gait velocity: decr   General Gait Details: cues for posture, position from RW and initial sequence   Stairs Stairs: Yes Stairs assistance: Min assist Stair Management: No rails;Step to pattern;Forwards;With walker Number of Stairs: 2 General stair comments: single step twice with RW and cues for sequence and foot/RW placement   Wheelchair Mobility    Modified Rankin (Stroke Patients Only)       Balance Overall balance assessment: Mild deficits observed, not  formally tested                                          Cognition Arousal/Alertness: Awake/alert Behavior During Therapy: WFL for tasks assessed/performed Overall Cognitive Status: Within Functional Limits for tasks assessed                                        Exercises Total Joint Exercises Ankle Circles/Pumps: AAROM;Left;Supine Quad Sets: AROM;Both;10 reps;Supine Heel Slides: AAROM;Left;20 reps;Supine Hip ABduction/ADduction: AAROM;Left;15 reps;Supine    General Comments        Pertinent Vitals/Pain Pain Assessment: 0-10 Pain Score: 5  Pain Location: L hip Pain Descriptors / Indicators: Aching;Sore Pain Intervention(s): Limited activity within patient's tolerance;Monitored during session;Premedicated before session;Ice applied    Home Living                      Prior Function            PT Goals (current goals can now be found in the care plan section) Acute Rehab PT Goals Patient Stated Goal: Regain IND PT Goal Formulation: With patient Time For Goal Achievement: 12/03/18 Potential to Achieve Goals: Good Progress towards PT goals: Progressing toward goals    Frequency    7X/week      PT Plan Current plan remains appropriate    Co-evaluation              AM-PAC PT "6 Clicks" Mobility  Outcome Measure  Help needed turning from your back to your side while in a flat bed without using bedrails?: A Little Help needed moving from lying on your back to sitting on the side of a flat bed without using bedrails?: A Little Help needed moving to and from a bed to a chair (including a wheelchair)?: A Little Help needed standing up from a chair using your arms (e.g., wheelchair or bedside chair)?: A Little Help needed to walk in hospital room?: A Little Help needed climbing 3-5 steps with a railing? : A Little 6 Click Score: 18    End of Session Equipment Utilized During Treatment: Gait belt Activity  Tolerance: Patient tolerated treatment well Patient left: in chair;with call bell/phone within reach;with family/visitor present;with chair alarm set Nurse Communication: Mobility status PT Visit Diagnosis: Difficulty in walking, not elsewhere classified (R26.2)     Time: 1224-4975 PT Time Calculation (min) (ACUTE ONLY): 30 min  Charges:  $Gait Training: 8-22 mins $Therapeutic Exercise: 8-22 mins                     Mauro Kaufmann PT Acute Rehabilitation Services Pager [phone] Office [phone]    Derek Velez 11/27/2018, 12:36 PM

## 2018-11-29 ENCOUNTER — Telehealth (INDEPENDENT_AMBULATORY_CARE_PROVIDER_SITE_OTHER): Payer: Self-pay | Admitting: Orthopaedic Surgery

## 2018-11-29 ENCOUNTER — Encounter (HOSPITAL_COMMUNITY): Payer: Self-pay | Admitting: Orthopaedic Surgery

## 2018-11-29 NOTE — Telephone Encounter (Signed)
Received call from [place] with [facility] needing verbal orders for HHPT 3 times a wk for 2 wks and 2 times a week for 1 wk. The number to contact [place] [facility]ts is [phone]

## 2018-11-29 NOTE — Telephone Encounter (Signed)
Verbal order given  

## 2018-12-06 ENCOUNTER — Telehealth (INDEPENDENT_AMBULATORY_CARE_PROVIDER_SITE_OTHER): Payer: Self-pay

## 2018-12-06 NOTE — Telephone Encounter (Signed)
Derek Velez aware more than likely this is a seroma but to call back if this becomes painful before his appt this Thursday and we can work him in to aspirate this

## 2018-12-06 NOTE — Telephone Encounter (Signed)
Trey Paula PTA with [facility] wanted to let you know that patient has some swelling on the left anterior hip over the incision area.  Patient has an appointment on Thursday, 12/09/2018 with Dr. Magnus Ivan.  Cb# is [phone].  Please advise.  Thank you.

## 2018-12-09 ENCOUNTER — Encounter (INDEPENDENT_AMBULATORY_CARE_PROVIDER_SITE_OTHER): Payer: Self-pay | Admitting: Orthopaedic Surgery

## 2018-12-09 ENCOUNTER — Ambulatory Visit (INDEPENDENT_AMBULATORY_CARE_PROVIDER_SITE_OTHER): Payer: Medicare Other | Admitting: Orthopaedic Surgery

## 2018-12-09 DIAGNOSIS — Z96642 Presence of left artificial hip joint: Secondary | ICD-10-CM

## 2018-12-09 NOTE — Progress Notes (Signed)
The patient is 2 weeks tomorrow status post a left total hip arthroplasty.  We replaced his right hip before.  He is very active 73 year old.  He is now on aspirin twice a day.  He says he is having no issues and swelling.  He is only taking Tylenol for pain.  On examination of his left hip his incision looks great I placed new Steri-Strips.  He does have a large seroma.  I drained at least 180 cc of fluid off the hip area.  He felt immediate relief.  He understands this will likely reaccumulate and I gave drain it off if it becomes uncomfortable and he keeps having issues with it.  I do not really need to see him back for 4 weeks but if he gets in the next week and is most to have this area drained off and he will let us know.  He will stop his aspirin as well.  All question concerns were answered and addressed.

## 2019-01-06 ENCOUNTER — Encounter (INDEPENDENT_AMBULATORY_CARE_PROVIDER_SITE_OTHER): Payer: Self-pay | Admitting: Orthopaedic Surgery

## 2019-01-06 ENCOUNTER — Other Ambulatory Visit: Payer: Self-pay

## 2019-01-06 ENCOUNTER — Ambulatory Visit (INDEPENDENT_AMBULATORY_CARE_PROVIDER_SITE_OTHER): Payer: Medicare Other | Admitting: Orthopaedic Surgery

## 2019-01-06 DIAGNOSIS — Z96642 Presence of left artificial hip joint: Secondary | ICD-10-CM

## 2019-01-06 NOTE — Progress Notes (Signed)
HPI: Derek Velez turns today 42 days status post left total hip arthroplasty.  He is overall doing well.  He has no complaints.  States he had some stiffness when first getting up but this is resolved.  He has had no shortness of breath fevers chills.  Physical exam: Left hip good range of motion without pain.  Left calf supple nontender.  Surgical incisions healing well no signs of infection or seroma.  Impression: 41 days status post left total hip arthroplasty  Plan: He will work on scar tissue mobilization.  Continue work on strengthening hip range of motion.  Follow-up with [place] in 8 months sooner if there is any questions or concerns.

## 2019-09-08 ENCOUNTER — Ambulatory Visit: Payer: Self-pay | Admitting: Physician Assistant

## 2019-12-11 IMAGING — DX DG PORTABLE PELVIS
1 series · 1 of 1 positions shown · non-contrast
Comparison: Radiographs November 01, 2018.

CLINICAL DATA: Status post left hip replacement.

EXAM:
PORTABLE PELVIS 1-2 VIEWS

[pelvis ap]
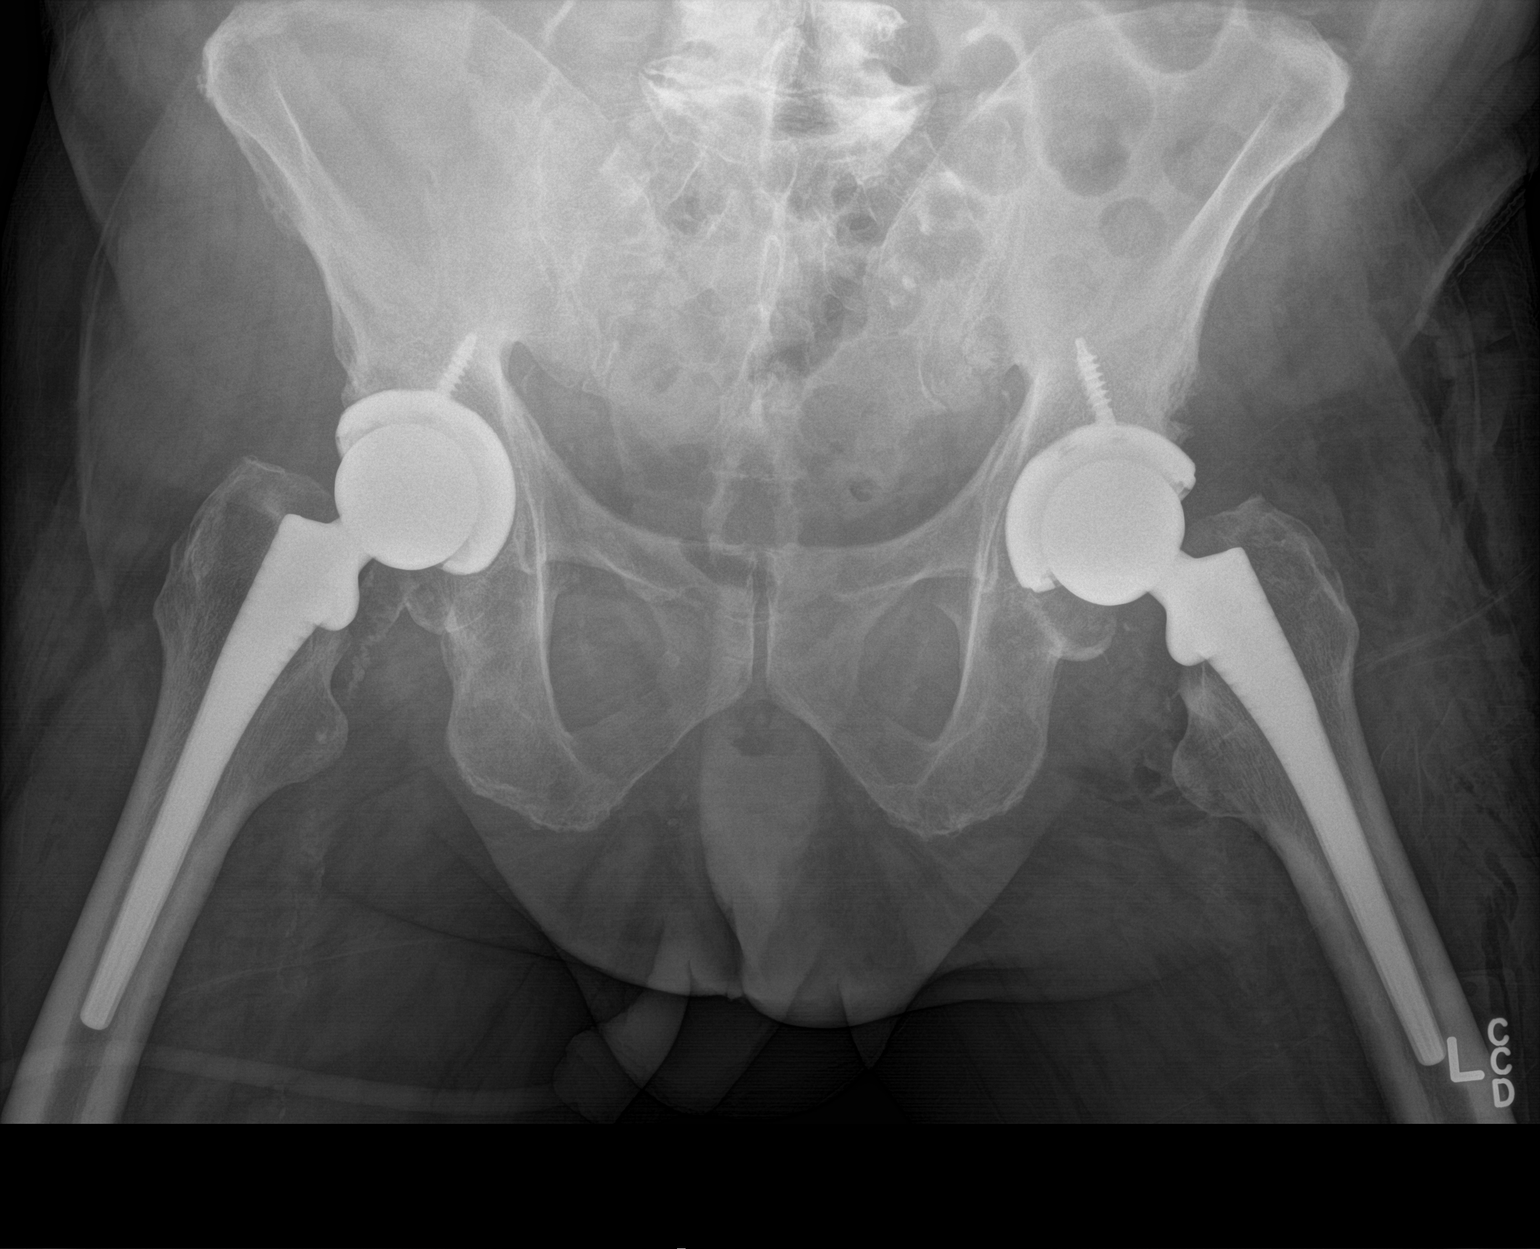

[1 of 1 positions shown; findings below may reference images not displayed]

FINDINGS: The left femoral and acetabular components appear to be well
situated. Expected postoperative changes are seen in the surrounding
soft tissues. Previous right total hip arthroplasty is again noted.
IMPRESSION: Interval placement of left total hip arthroplasty.

## 2022-07-27 DEATH — deceased
